# Patient Record
Sex: Female | Born: 1970
Health system: Southern US, Community
[De-identification: ages and names within clinical notes are randomized; demographics above are authoritative.]

## PROBLEM LIST (undated history)

## (undated) DIAGNOSIS — I10 Essential (primary) hypertension: Secondary | ICD-10-CM

## (undated) HISTORY — PX: TUBAL LIGATION: SHX77

## (undated) HISTORY — DX: Essential (primary) hypertension: I10

---

## 2012-08-02 ENCOUNTER — Ambulatory Visit: Payer: Self-pay | Admitting: Family Medicine

## 2012-08-02 VITALS — BP 158/100 | HR 83 | Temp 98.0°F | Resp 20 | Ht 61.05 in | Wt 137.8 lb

## 2012-08-02 DIAGNOSIS — L853 Xerosis cutis: Secondary | ICD-10-CM

## 2012-08-02 DIAGNOSIS — I10 Essential (primary) hypertension: Secondary | ICD-10-CM

## 2012-08-02 MED ORDER — AMLODIPINE BESYLATE 5 MG PO TABS: 5.0000 mg | ORAL_TABLET | Freq: Every day | ORAL | Status: DC

## 2012-08-02 MED ORDER — TRIAMCINOLONE ACETONIDE 0.1 % EX CREA: TOPICAL_CREAM | Freq: Two times a day (BID) | CUTANEOUS | Status: DC

## 2012-08-02 NOTE — Patient Instructions (Signed)

## 2012-08-02 NOTE — Progress Notes (Signed)
@  [email or web address]@  Patient ID: Donna Barrera MRN: [id number], DOB: [birth year], 41 y.o. Date of Encounter: 08/02/2012, 10:49 AM  Primary Physician: No primary provider on file.  Chief Complaint: HTN  HPI: 41 y.o. year old female with history below presents for hypertension follow up.  Diet consists of No CP, HA, visual changes, or focal deficits.   History reviewed. No pertinent past medical history.   Home Meds: Prior to Admission medications   Not on File    Allergies: No Known Allergies  History   Social History  . Marital Status: Married    Spouse Name: N/A    Number of Children: N/A  . Years of Education: N/A   Occupational History  . Not on file.   Social History Main Topics  . Smoking status: Never Smoker   . Smokeless tobacco: Not on file  . Alcohol Use: No  . Drug Use: No  . Sexually Active: Yes    Birth Control/ Protection: None   Other Topics Concern  . Not on file   Social History Narrative  . No narrative on file     Family History  Problem Relation Age of Onset  . Hypertension Maternal Grandmother     Review of Systems: Constitutional: negative for chills, fever, night sweats, weight changes, or fatigue  HEENT: negative for vision changes, hearing loss, congestion, rhinorrhea, ST, epistaxis, or sinus pressure Cardiovascular: negative for chest pain, palpitations, or DOE Respiratory: negative for hemoptysis, wheezing, shortness of breath, or cough Abdominal: negative for abdominal pain, nausea, vomiting, diarrhea, or constipation Dermatological: negative for rash Neurologic: negative for headache, dizziness, or syncope All other systems reviewed and are otherwise negative with the exception to those above and in the HPI.   Physical Exam:   150/108 Blood pressure 158/100, pulse 83, temperature 98 F (36.7 C), temperature source Oral, resp. rate 20, height 5' 1.05" (1.551 m), weight 137 lb 12.8 oz (62.506 kg), last menstrual period  07/12/2012, SpO2 100.00%., Body mass index is 25.99 kg/(m^2). General: Well developed, well nourished, in no acute distress. Head: Normocephalic, atraumatic, eyes without discharge, sclera non-icteric, nares are without discharge. Bilateral auditory canals clear, TM's are without perforation, pearly grey and translucent with reflective cone of light bilaterally. Oral cavity moist, posterior pharynx without exudate, erythema, peritonsillar abscess, or post nasal drip.  Neck: Supple. No thyromegaly. Full ROM. No lymphadenopathy. No carotid bruits. Lungs: Clear bilaterally to auscultation without wheezes, rales, or rhonchi. Breathing is unlabored. Heart: RRR with S1 S2. No murmurs, rubs, or gallops appreciated.  Abdomen: Soft, non-tender, non-distended with normoactive bowel sounds. No hepatosplenomegaly. No rebound/guarding. No obvious abdominal masses. Msk:  Strength and tone normal for age. Extremities/Skin: Warm and dry. No clubbing or cyanosis. No edema. No rashes or suspicious lesions. Distal pulses 2+ and equal bilaterally. Neuro: Alert and oriented X 3. Moves all extremities spontaneously. Gait is normal. CNII-XII grossly in tact. DTR 2+, cerebellar function intact. Rhomberg normal. Psych:  Responds to questions appropriately with a normal affect.    ASSESSMENT AND PLAN:  41 y.o. year old female with hypertension -  Signed, Elvina Sidle, MD 08/02/2012 10:49 AM

## 2016-04-07 ENCOUNTER — Other Ambulatory Visit: Payer: Self-pay | Admitting: Obstetrics and Gynecology

## 2016-04-07 ENCOUNTER — Other Ambulatory Visit (HOSPITAL_COMMUNITY)
Admission: RE | Admit: 2016-04-07 | Discharge: 2016-04-07 | Disposition: A | Discharge status: Home / Self Care | Payer: 59 | Source: Ambulatory Visit | Attending: Obstetrics and Gynecology | Admitting: Obstetrics and Gynecology

## 2016-04-07 DIAGNOSIS — Z01419 Encounter for gynecological examination (general) (routine) without abnormal findings: Secondary | ICD-10-CM | POA: Diagnosis present

## 2016-04-07 DIAGNOSIS — Z1151 Encounter for screening for human papillomavirus (HPV): Secondary | ICD-10-CM | POA: Diagnosis present

## 2016-04-08 ENCOUNTER — Other Ambulatory Visit: Payer: Self-pay | Admitting: Obstetrics and Gynecology

## 2016-04-08 DIAGNOSIS — N6452 Nipple discharge: Secondary | ICD-10-CM

## 2016-04-09 LAB — CYTOLOGY - PAP

## 2016-04-13 ENCOUNTER — Ambulatory Visit
Admission: RE | Admit: 2016-04-13 | Discharge: 2016-04-13 | Disposition: A | Discharge status: Home / Self Care | Payer: 59 | Source: Ambulatory Visit | Attending: Obstetrics and Gynecology | Admitting: Obstetrics and Gynecology

## 2016-04-13 ENCOUNTER — Other Ambulatory Visit: Payer: Self-pay

## 2016-04-13 DIAGNOSIS — N6452 Nipple discharge: Secondary | ICD-10-CM

## 2016-05-07 ENCOUNTER — Telehealth: Payer: Self-pay | Admitting: Emergency Medicine

## 2016-05-07 ENCOUNTER — Ambulatory Visit (INDEPENDENT_AMBULATORY_CARE_PROVIDER_SITE_OTHER): Payer: 59 | Admitting: Family Medicine

## 2016-05-07 VITALS — BP 130/84 | HR 83 | Temp 98.3°F | Resp 16 | Ht 61.5 in | Wt 143.0 lb

## 2016-05-07 DIAGNOSIS — F439 Reaction to severe stress, unspecified: Secondary | ICD-10-CM

## 2016-05-07 DIAGNOSIS — I10 Essential (primary) hypertension: Secondary | ICD-10-CM | POA: Diagnosis not present

## 2016-05-07 DIAGNOSIS — Z658 Other specified problems related to psychosocial circumstances: Secondary | ICD-10-CM | POA: Diagnosis not present

## 2016-05-07 MED ORDER — AMLODIPINE BESYLATE 2.5 MG PO TABS: 2.5000 mg | ORAL_TABLET | Freq: Every day | ORAL | 1 refills | Status: DC

## 2016-05-07 MED ORDER — AMLODIPINE BESYLATE 5 MG PO TABS: 5.0000 mg | ORAL_TABLET | Freq: Every day | ORAL | 2 refills | Status: DC

## 2016-05-07 NOTE — Progress Notes (Signed)
By signing my name below, I, Donna Barrera, attest that this documentation has been prepared under the direction and in the presence of Donna Ray, MD.  Electronically Signed: Verlee Barrera, Medical Scribe. 05/07/16. 5:39 PM.  Subjective:    Patient ID: Donna Barrera, female    DOB: [birth year], 45 y.o.   MRN: [id number]  HPI Chief Complaint  Patient presents with  . Hypertension    HPI Comments: Donna Barrera is a 45 y.o. female who presents to the [facility] complaining of HTN. Pt was last seen by Donna Barrera 08/02/2012 for HTN. Pt stopped taking Norvasc 5 mg QD 2 weeks after her 07/2012 visit. Pt has changed her diet afterwards and her bp has changed back to 120/80's, but she's been eating a lot of "seasoned foods" lately. Pt is not on medication for her high blood pressure, and her bp has been fluctuating for the past 3 weeks. Pt was checking her bp prior to 3 weeks and had nl readings. Pt first noticed her bp when she went to her OB/GYN, Donna Barrera [facility] [address], and her bp was around 146/90. When she got her bp checked at [facility] recently, her readings were 134-150/94, and 174/103. Pt has been stressing about family, school, and work. Pt mentions she's been "making sure everything goes accordingly at home". Pt is a mother of 3 kids at the ages of 51, 76 ,and 31. Pt thinks she's doing a good job at balancing her life, but she gets occasionally overwhelmed. Pt goes to the gym about twice a week. Pt is taking online classes school for [employer]. 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 Pt denies eating any frozen meals. Pt denies experiencing chest pain, light headedness, focal weakness, difficulty with speech, syncope, chest tightness, palpitations, melena, fatigue and any other symptoms.  There are no active problems to display for this patient.  No past medical history on file. Past Surgical History:  Procedure Laterality  Date  . CESAREAN SECTION     No Known Allergies Prior to Admission medications   Medication Sig Start Date End Date Taking? Authorizing Provider  amLODipine (NORVASC) 5 MG tablet Take 1 tablet (5 mg total) by mouth daily. Patient not taking: Reported on 05/07/2016 08/02/12   Donna Haber, MD  triamcinolone cream (KENALOG) 0.1 % Apply topically 2 (two) times daily. Patient not taking: Reported on 05/07/2016 08/02/12   Donna Haber, MD   Social History   Social History  . Marital status: Married    Spouse name: N/A  . Number of children: N/A  . Years of education: N/A   Occupational History  . Not on file.   Social History Main Topics  . Smoking status: Never Smoker  . Smokeless tobacco: Not on file  . Alcohol use No  . Drug use: No  . Sexual activity: Yes    Birth control/ protection: None   Other Topics Concern  . Not on file   Social History Narrative  . No narrative on file   Depression screen [facility] 2/9 05/07/2016  Decreased Interest 0  Down, Depressed, Hopeless 0  PHQ - 2 Score 0   Review of Systems  Constitutional: Negative for fatigue and unexpected weight change.  Respiratory: Negative for chest tightness and shortness of breath.   Cardiovascular: Negative for chest pain, palpitations and leg swelling.  Gastrointestinal: Negative for abdominal pain and blood in stool.  Neurological: Negative for dizziness, syncope, facial asymmetry, speech difficulty,  light-headedness and headaches.   Objective:  Physical Exam  Constitutional: She is oriented to person, place, and time. She appears well-developed and well-nourished.  HENT:  Head: Normocephalic and atraumatic.  Eyes: Conjunctivae and EOM are normal. Pupils are equal, round, and reactive to light. Right eye exhibits no nystagmus. Left eye exhibits no nystagmus.  Neck: Carotid bruit is not present.  Cardiovascular: Normal rate, regular rhythm, normal heart sounds and intact distal pulses.  Exam reveals no  friction rub.   No murmur heard. Pulmonary/Chest: Effort normal and breath sounds normal. No respiratory distress. She has no wheezes. She has no rales.  Abdominal: Soft. She exhibits no pulsatile midline mass. There is no tenderness.  Musculoskeletal: She exhibits no edema (lower extremity).  Neurological: She is alert and oriented to person, place, and time. She displays a negative Romberg sign.  No pronator drift  Skin: Skin is warm and dry.  Psychiatric: She has a normal mood and affect. Her behavior is normal.  Vitals reviewed.  BP 130/84   Pulse 83   Temp 98.3 F (36.8 C)   Resp 16   Ht 5' 1.5" (1.562 m)   Wt 143 lb (64.9 kg)   LMP 04/21/2016 (Approximate)   SpO2 100%   BMI 26.58 kg/m  Assessment & Plan:    Donna Barrera is a 45 y.o. female Essential hypertension - Plan: amLODipine (NORVASC) 2.5 MG tablet, DISCONTINUED: amLODipine (NORVASC) 5 MG tablet  -Multiple elevated readings out of office. Borderline in office. History of hypertension. Will restart Norvasc at low dose, 2.5 mg. Also discussed DASH diet/salt in the diet, exercise/activity, as well as stress management as below. Follow-up in the next 3-4 weeks, sooner if worse.  -no lab work was done today as this was done recently by her other providers, and she plans to bring a copy of this for me to review.   Situational stress  -Stress management techniques discussed. Can return to discuss further if having increased difficulty.  Meds ordered this encounter  Medications  . DISCONTD: amLODipine (NORVASC) 5 MG tablet    Sig: Take 1 tablet (5 mg total) by mouth daily.    Dispense:  30 tablet    Refill:  2  . amLODipine (NORVASC) 2.5 MG tablet    Sig: Take 1 tablet (2.5 mg total) by mouth daily.    Dispense:  30 tablet    Refill:  1   Patient Instructions    Restart amlodipine at low dose (2.59m each day). See information below on salt and other diet changes for blood pressure. Increase walking/low  intensity exercise to most if not all days per week. You can also see other information below on stress and stress management. Follow-up with me in 3-4 weeks to recheck blood pressure, and you can check your blood pressure outside of office and keep a record of those readings for that next visit. Please have your other doctor send a copy of your lab work to me or bring a copy of it for me to review.    Stress and Stress Management Stress is a normal reaction to life events. It is what you feel when life demands more than you are used to or more than you can handle. Some stress can be useful. For example, the stress reaction can help you catch the last bus of the day, study for a test, or meet a deadline at work. But stress that occurs too often or for too long can cause problems. It can  affect your emotional health and interfere with relationships and normal daily activities. Too much stress can weaken your immune system and increase your risk for physical illness. If you already have a medical problem, stress can make it worse. CAUSES  All sorts of life events may cause stress. An event that causes stress for one person may not be stressful for another person. Major life events commonly cause stress. These may be positive or negative. Examples include losing your job, moving into a new home, getting married, having a baby, or losing a loved one. Less obvious life events may also cause stress, especially if they occur day after day or in combination. Examples include working long hours, driving in traffic, caring for children, being in debt, or being in a difficult relationship. SIGNS AND SYMPTOMS Stress may cause emotional symptoms including, the following:  Anxiety. This is feeling worried, afraid, on edge, overwhelmed, or out of control.  Anger. This is feeling irritated or impatient.  Depression. This is feeling sad, down, helpless, or guilty.  Difficulty focusing, remembering, or making  decisions. Stress may cause physical symptoms, including the following:   Aches and pains. These may affect your head, neck, back, stomach, or other areas of your body.  Tight muscles or clenched jaw.  Low energy or trouble sleeping. Stress may cause unhealthy behaviors, including the following:   Eating to feel better (overeating) or skipping meals.  Sleeping too little, too much, or both.  Working too much or putting off tasks (procrastination).  Smoking, drinking alcohol, or using drugs to feel better. DIAGNOSIS  Stress is diagnosed through an assessment by your health care provider. Your health care provider will ask questions about your symptoms and any stressful life events.Your health care provider will also ask about your medical history and may order blood tests or other tests. Certain medical conditions and medicine can cause physical symptoms similar to stress. Mental illness can cause emotional symptoms and unhealthy behaviors similar to stress. Your health care provider may refer you to a mental health professional for further evaluation.  TREATMENT  Stress management is the recommended treatment for stress.The goals of stress management are reducing stressful life events and coping with stress in healthy ways.  Techniques for reducing stressful life events include the following:  Stress identification. Self-monitor for stress and identify what causes stress for you. These skills may help you to avoid some stressful events.  Time management. Set your priorities, keep a calendar of events, and learn to say "no." These tools can help you avoid making too many commitments. Techniques for coping with stress include the following:  Rethinking the problem. Try to think realistically about stressful events rather than ignoring them or overreacting. Try to find the positives in a stressful situation rather than focusing on the negatives.  Exercise. Physical exercise can release  both physical and emotional tension. The key is to find a form of exercise you enjoy and do it regularly.  Relaxation techniques. These relax the body and mind. Examples include yoga, meditation, tai chi, biofeedback, deep breathing, progressive muscle relaxation, listening to music, being out in nature, journaling, and other hobbies. Again, the key is to find one or more that you enjoy and can do regularly.  Healthy lifestyle. Eat a balanced diet, get plenty of sleep, and do not smoke. Avoid using alcohol or drugs to relax.  Strong support network. Spend time with family, friends, or other people you enjoy being around.Express your feelings and talk things over with  someone you trust. Counseling or talktherapy with a mental health professional may be helpful if you are having difficulty managing stress on your own. Medicine is typically not recommended for the treatment of stress.Talk to your health care provider if you think you need medicine for symptoms of stress. HOME CARE INSTRUCTIONS  Keep all follow-up visits as directed by your health care provider.  Take all medicines as directed by your health care provider. SEEK MEDICAL CARE IF:  Your symptoms get worse or you start having new symptoms.  You feel overwhelmed by your problems and can no longer manage them on your own. SEEK IMMEDIATE MEDICAL CARE IF:  You feel like hurting yourself or someone else.   This information is not intended to replace advice given to you by your health care provider. Make sure you discuss any questions you have with your health care provider.   Document Released: 03/10/2001 Document Revised: 10/05/2014 Document Reviewed: 05/09/2013 Elsevier Interactive Patient Education 2016 [place] DASH stands for "Dietary Approaches to Stop Hypertension." The DASH eating plan is a healthy eating plan that has been shown to reduce high blood pressure (hypertension). Additional health  benefits may include reducing the risk of type 2 diabetes mellitus, heart disease, and stroke. The DASH eating plan may also help with weight loss. WHAT DO I NEED TO KNOW ABOUT THE DASH EATING PLAN? For the DASH eating plan, you will follow these general guidelines:  Choose foods with a percent daily value for sodium of less than 5% (as listed on the food label).  Use salt-free seasonings or herbs instead of table salt or sea salt.  Check with your health care provider or pharmacist before using salt substitutes.  Eat lower-sodium products, often labeled as "lower sodium" or "no salt added."  Eat fresh foods.  Eat more vegetables, fruits, and low-fat dairy products.  Choose whole grains. Look for the word "whole" as the first word in the ingredient list.  Choose fish and skinless chicken or [place] more often than red meat. Limit fish, poultry, and meat to 6 oz (170 g) each day.  Limit sweets, desserts, sugars, and sugary drinks.  Choose heart-healthy fats.  Limit cheese to 1 oz (28 g) per day.  Eat more home-cooked food and less restaurant, buffet, and fast food.  Limit fried foods.  Cook foods using methods other than frying.  Limit canned vegetables. If you do use them, rinse them well to decrease the sodium.  When eating at a restaurant, ask that your food be prepared with less salt, or no salt if possible. WHAT FOODS CAN I EAT? Seek help from a dietitian for individual calorie needs. Grains Whole grain or whole wheat bread. Brown rice. Whole grain or whole wheat pasta. Quinoa, bulgur, and whole grain cereals. Low-sodium cereals. Corn or whole wheat flour tortillas. Whole grain cornbread. Whole grain crackers. Low-sodium crackers. Vegetables Fresh or frozen vegetables (raw, steamed, roasted, or grilled). Low-sodium or reduced-sodium tomato and vegetable juices. Low-sodium or reduced-sodium tomato sauce and paste. Low-sodium or reduced-sodium canned vegetables.  Fruits All  fresh, canned (in natural juice), or frozen fruits. Meat and Other Protein Products Ground beef (85% or leaner), grass-fed beef, or beef trimmed of fat. Skinless chicken or [place]. Ground chicken or [place]. Pork trimmed of fat. All fish and seafood. Eggs. Dried beans, peas, or lentils. Unsalted nuts and seeds. Unsalted canned beans. Dairy Low-fat dairy products, such as skim or 1% milk, 2% or reduced-fat cheeses, low-fat  ricotta or cottage cheese, or plain low-fat yogurt. Low-sodium or reduced-sodium cheeses. Fats and Oils Tub margarines without trans fats. Light or reduced-fat mayonnaise and salad dressings (reduced sodium). Avocado. Safflower, olive, or canola oils. Natural peanut or almond butter. Other Unsalted popcorn and pretzels. The items listed above may not be a complete list of recommended foods or beverages. Contact your dietitian for more options. WHAT FOODS ARE NOT RECOMMENDED? Grains White bread. White pasta. White rice. Refined cornbread. Bagels and croissants. Crackers that contain trans fat. Vegetables Creamed or fried vegetables. Vegetables in a cheese sauce. Regular canned vegetables. Regular canned tomato sauce and paste. Regular tomato and vegetable juices. Fruits Dried fruits. Canned fruit in light or heavy syrup. Fruit juice. Meat and Other Protein Products Fatty cuts of meat. Ribs, chicken wings, bacon, sausage, bologna, salami, chitterlings, fatback, hot dogs, bratwurst, and packaged luncheon meats. Salted nuts and seeds. Canned beans with salt. Dairy Whole or 2% milk, cream, half-and-half, and cream cheese. Whole-fat or sweetened yogurt. Full-fat cheeses or blue cheese. Nondairy creamers and whipped toppings. Processed cheese, cheese spreads, or cheese curds. Condiments Onion and garlic salt, seasoned salt, table salt, and sea salt. Canned and packaged gravies. Worcestershire sauce. Tartar sauce. Barbecue sauce. Teriyaki sauce. Soy sauce, including reduced sodium.  Steak sauce. Fish sauce. Oyster sauce. Cocktail sauce. Horseradish. Ketchup and mustard. Meat flavorings and tenderizers. Bouillon cubes. Hot sauce. Tabasco sauce. Marinades. Taco seasonings. Relishes. Fats and Oils Butter, stick margarine, lard, shortening, ghee, and bacon fat. Coconut, palm kernel, or palm oils. Regular salad dressings. Other Pickles and olives. Salted popcorn and pretzels. The items listed above may not be a complete list of foods and beverages to avoid. Contact your dietitian for more information. WHERE CAN I FIND MORE INFORMATION? National Heart, Lung, and Blood Institute: travelstabloid.com   This information is not intended to replace advice given to you by your health care provider. Make sure you discuss any questions you have with your health care provider.   Document Released: 09/03/2011 Document Revised: 10/05/2014 Document Reviewed: 07/19/2013 Elsevier Interactive Patient Education 2016 [employer].   Hypertension Hypertension, commonly called high blood pressure, is when the force of blood pumping through your arteries is too strong. Your arteries are the blood vessels that carry blood from your heart throughout your body. A blood pressure reading consists of a higher number over a lower number, such as 110/72. The higher number (systolic) is the pressure inside your arteries when your heart pumps. The lower number (diastolic) is the pressure inside your arteries when your heart relaxes. Ideally you want your blood pressure below 120/80. Hypertension forces your heart to work harder to pump blood. Your arteries may become narrow or stiff. Having untreated or uncontrolled hypertension can cause heart attack, stroke, kidney disease, and other problems. RISK FACTORS Some risk factors for high blood pressure are controllable. Others are not.  Risk factors you cannot control include:   Race. You may be at higher risk if you are African  American.  Age. Risk increases with age.  Gender. Men are at higher risk than women before age 76 years. After age 75, women are at higher risk than men. Risk factors you can control include:  Not getting enough exercise or physical activity.  Being overweight.  Getting too much fat, sugar, calories, or salt in your diet.  Drinking too much alcohol. SIGNS AND SYMPTOMS Hypertension does not usually cause signs or symptoms. Extremely high blood pressure (hypertensive crisis) may cause headache, anxiety, shortness of  breath, and nosebleed. DIAGNOSIS To check if you have hypertension, your health care provider will measure your blood pressure while you are seated, with your arm held at the level of your heart. It should be measured at least twice using the same arm. Certain conditions can cause a difference in blood pressure between your right and left arms. A blood pressure reading that is higher than normal on one occasion does not mean that you need treatment. If it is not clear whether you have high blood pressure, you may be asked to return on a different day to have your blood pressure checked again. Or, you may be asked to monitor your blood pressure at home for 1 or more weeks. TREATMENT Treating high blood pressure includes making lifestyle changes and possibly taking medicine. Living a healthy lifestyle can help lower high blood pressure. You may need to change some of your habits. Lifestyle changes may include:  Following the DASH diet. This diet is high in fruits, vegetables, and whole grains. It is low in salt, red meat, and added sugars.  Keep your sodium intake below 2,300 mg per day.  Getting at least 30-45 minutes of aerobic exercise at least 4 times per week.  Losing weight if necessary.  Not smoking.  Limiting alcoholic beverages.  Learning ways to reduce stress. Your health care provider may prescribe medicine if lifestyle changes are not enough to get your blood  pressure under control, and if one of the following is true:  You are 31-76 years of age and your systolic blood pressure is above 140.  You are 22 years of age or older, and your systolic blood pressure is above 150.  Your diastolic blood pressure is above 90.  You have diabetes, and your systolic blood pressure is over 322 or your diastolic blood pressure is over 90.  You have kidney disease and your blood pressure is above 140/90.  You have heart disease and your blood pressure is above 140/90. Your personal target blood pressure may vary depending on your medical conditions, your age, and other factors. HOME CARE INSTRUCTIONS  Have your blood pressure rechecked as directed by your health care provider.   Take medicines only as directed by your health care provider. Follow the directions carefully. Blood pressure medicines must be taken as prescribed. The medicine does not work as well when you skip doses. Skipping doses also puts you at risk for problems.  Do not smoke.   Monitor your blood pressure at home as directed by your health care provider. SEEK MEDICAL CARE IF:   You think you are having a reaction to medicines taken.  You have recurrent headaches or feel dizzy.  You have swelling in your ankles.  You have trouble with your vision. SEEK IMMEDIATE MEDICAL CARE IF:  You develop a severe headache or confusion.  You have unusual weakness, numbness, or feel faint.  You have severe chest or abdominal pain.  You vomit repeatedly.  You have trouble breathing. MAKE SURE YOU:   Understand these instructions.  Will watch your condition.  Will get help right away if you are not doing well or get worse.   This information is not intended to replace advice given to you by your health care provider. Make sure you discuss any questions you have with your health care provider.   Document Released: 09/14/2005 Document Revised: 01/29/2015 Document Reviewed:  07/07/2013 Elsevier Interactive Patient Education 2016 [employer].     IF you received an x-Barrera  today, you will receive an invoice from [facility]. Please contact [facility] at [phone] with questions or concerns regarding your invoice.   IF you received labwork today, you will receive an invoice from [employer]. Please contact [facility] at [phone] with questions or concerns regarding your invoice.   Our billing staff will not be able to assist you with questions regarding bills from these companies.  You will be contacted with the lab results as soon as they are available. The fastest way to get your results is to activate your My Chart account. Instructions are located on the last page of this paperwork. If you have not heard from [place] regarding the results in 2 weeks, please contact this office.      I personally performed the services described in this documentation, which was scribed in my presence. The recorded information has been reviewed and considered, and addended by me as needed.   Signed,   Donna Ray, MD [facility].  05/08/16 10:40 PM

## 2016-05-07 NOTE — Telephone Encounter (Signed)
Called pharmacy to change/verify ordered medication Amlodopine- Clarified pt needs to pick up 2.5mg  tab and not the .   tab order cancelled per pharmacist

## 2016-05-07 NOTE — Patient Instructions (Addendum)
Restart amlodipine at low dose (2.75m each day). See information below on salt and other diet changes for blood pressure. Increase walking/low intensity exercise to most if not all days per week. You can also see other information below on stress and stress management. Follow-up with me in 3-4 weeks to recheck blood pressure, and you can check your blood pressure outside of office and keep a record of those readings for that next visit. Please have your other doctor send a copy of your lab work to me or bring a copy of it for me to review.    Stress and Stress Management Stress is a normal reaction to life events. It is what you feel when life demands more than you are used to or more than you can handle. Some stress can be useful. For example, the stress reaction can help you catch the last bus of the day, study for a test, or meet a deadline at work. But stress that occurs too often or for too long can cause problems. It can affect your emotional health and interfere with relationships and normal daily activities. Too much stress can weaken your immune system and increase your risk for physical illness. If you already have a medical problem, stress can make it worse. CAUSES  All sorts of life events may cause stress. An event that causes stress for one person may not be stressful for another person. Major life events commonly cause stress. These may be positive or negative. Examples include losing your job, moving into a new home, getting married, having a baby, or losing a loved one. Less obvious life events may also cause stress, especially if they occur day after day or in combination. Examples include working long hours, driving in traffic, caring for children, being in debt, or being in a difficult relationship. SIGNS AND SYMPTOMS Stress may cause emotional symptoms including, the following:  Anxiety. This is feeling worried, afraid, on edge, overwhelmed, or out of control.  Anger. This is  feeling irritated or impatient.  Depression. This is feeling sad, down, helpless, or guilty.  Difficulty focusing, remembering, or making decisions. Stress may cause physical symptoms, including the following:   Aches and pains. These may affect your head, neck, back, stomach, or other areas of your body.  Tight muscles or clenched jaw.  Low energy or trouble sleeping. Stress may cause unhealthy behaviors, including the following:   Eating to feel better (overeating) or skipping meals.  Sleeping too little, too much, or both.  Working too much or putting off tasks (procrastination).  Smoking, drinking alcohol, or using drugs to feel better. DIAGNOSIS  Stress is diagnosed through an assessment by your health care provider. Your health care provider will ask questions about your symptoms and any stressful life events.Your health care provider will also ask about your medical history and may order blood tests or other tests. Certain medical conditions and medicine can cause physical symptoms similar to stress. Mental illness can cause emotional symptoms and unhealthy behaviors similar to stress. Your health care provider may refer you to a mental health professional for further evaluation.  TREATMENT  Stress management is the recommended treatment for stress.The goals of stress management are reducing stressful life events and coping with stress in healthy ways.  Techniques for reducing stressful life events include the following:  Stress identification. Self-monitor for stress and identify what causes stress for you. These skills may help you to avoid some stressful events.  Time management. Set your priorities, keep  a calendar of events, and learn to say "no." These tools can help you avoid making too many commitments. Techniques for coping with stress include the following:  Rethinking the problem. Try to think realistically about stressful events rather than ignoring them or  overreacting. Try to find the positives in a stressful situation rather than focusing on the negatives.  Exercise. Physical exercise can release both physical and emotional tension. The key is to find a form of exercise you enjoy and do it regularly.  Relaxation techniques. These relax the body and mind. Examples include yoga, meditation, tai chi, biofeedback, deep breathing, progressive muscle relaxation, listening to music, being out in nature, journaling, and other hobbies. Again, the key is to find one or more that you enjoy and can do regularly.  Healthy lifestyle. Eat a balanced diet, get plenty of sleep, and do not smoke. Avoid using alcohol or drugs to relax.  Strong support network. Spend time with family, friends, or other people you enjoy being around.Express your feelings and talk things over with someone you trust. Counseling or talktherapy with a mental health professional may be helpful if you are having difficulty managing stress on your own. Medicine is typically not recommended for the treatment of stress.Talk to your health care provider if you think you need medicine for symptoms of stress. HOME CARE INSTRUCTIONS  Keep all follow-up visits as directed by your health care provider.  Take all medicines as directed by your health care provider. SEEK MEDICAL CARE IF:  Your symptoms get worse or you start having new symptoms.  You feel overwhelmed by your problems and can no longer manage them on your own. SEEK IMMEDIATE MEDICAL CARE IF:  You feel like hurting yourself or someone else.   This information is not intended to replace advice given to you by your health care provider. Make sure you discuss any questions you have with your health care provider.   Document Released: 03/10/2001 Document Revised: 10/05/2014 Document Reviewed: 05/09/2013 Elsevier Interactive Patient Education 2016 [place] DASH stands for "Dietary Approaches to Stop  Hypertension." The DASH eating plan is a healthy eating plan that has been shown to reduce high blood pressure (hypertension). Additional health benefits may include reducing the risk of type 2 diabetes mellitus, heart disease, and stroke. The DASH eating plan may also help with weight loss. WHAT DO I NEED TO KNOW ABOUT THE DASH EATING PLAN? For the DASH eating plan, you will follow these general guidelines:  Choose foods with a percent daily value for sodium of less than 5% (as listed on the food label).  Use salt-free seasonings or herbs instead of table salt or sea salt.  Check with your health care provider or pharmacist before using salt substitutes.  Eat lower-sodium products, often labeled as "lower sodium" or "no salt added."  Eat fresh foods.  Eat more vegetables, fruits, and low-fat dairy products.  Choose whole grains. Look for the word "whole" as the first word in the ingredient list.  Choose fish and skinless chicken or [place] more often than red meat. Limit fish, poultry, and meat to 6 oz (170 g) each day.  Limit sweets, desserts, sugars, and sugary drinks.  Choose heart-healthy fats.  Limit cheese to 1 oz (28 g) per day.  Eat more home-cooked food and less restaurant, buffet, and fast food.  Limit fried foods.  Cook foods using methods other than frying.  Limit canned vegetables. If you do use them, rinse  them well to decrease the sodium.  When eating at a restaurant, ask that your food be prepared with less salt, or no salt if possible. WHAT FOODS CAN I EAT? Seek help from a dietitian for individual calorie needs. Grains Whole grain or whole wheat bread. Brown rice. Whole grain or whole wheat pasta. Quinoa, bulgur, and whole grain cereals. Low-sodium cereals. Corn or whole wheat flour tortillas. Whole grain cornbread. Whole grain crackers. Low-sodium crackers. Vegetables Fresh or frozen vegetables (raw, steamed, roasted, or grilled). Low-sodium or  reduced-sodium tomato and vegetable juices. Low-sodium or reduced-sodium tomato sauce and paste. Low-sodium or reduced-sodium canned vegetables.  Fruits All fresh, canned (in natural juice), or frozen fruits. Meat and Other Protein Products Ground beef (85% or leaner), grass-fed beef, or beef trimmed of fat. Skinless chicken or [place]. Ground chicken or [place]. Pork trimmed of fat. All fish and seafood. Eggs. Dried beans, peas, or lentils. Unsalted nuts and seeds. Unsalted canned beans. Dairy Low-fat dairy products, such as skim or 1% milk, 2% or reduced-fat cheeses, low-fat ricotta or cottage cheese, or plain low-fat yogurt. Low-sodium or reduced-sodium cheeses. Fats and Oils Tub margarines without trans fats. Light or reduced-fat mayonnaise and salad dressings (reduced sodium). Avocado. Safflower, olive, or canola oils. Natural peanut or almond butter. Other Unsalted popcorn and pretzels. The items listed above may not be a complete list of recommended foods or beverages. Contact your dietitian for more options. WHAT FOODS ARE NOT RECOMMENDED? Grains White bread. White pasta. White rice. Refined cornbread. Bagels and croissants. Crackers that contain trans fat. Vegetables Creamed or fried vegetables. Vegetables in a cheese sauce. Regular canned vegetables. Regular canned tomato sauce and paste. Regular tomato and vegetable juices. Fruits Dried fruits. Canned fruit in light or heavy syrup. Fruit juice. Meat and Other Protein Products Fatty cuts of meat. Ribs, chicken wings, bacon, sausage, bologna, salami, chitterlings, fatback, hot dogs, bratwurst, and packaged luncheon meats. Salted nuts and seeds. Canned beans with salt. Dairy Whole or 2% milk, cream, half-and-half, and cream cheese. Whole-fat or sweetened yogurt. Full-fat cheeses or blue cheese. Nondairy creamers and whipped toppings. Processed cheese, cheese spreads, or cheese curds. Condiments Onion and garlic salt, seasoned salt,  table salt, and sea salt. Canned and packaged gravies. Worcestershire sauce. Tartar sauce. Barbecue sauce. Teriyaki sauce. Soy sauce, including reduced sodium. Steak sauce. Fish sauce. Oyster sauce. Cocktail sauce. Horseradish. Ketchup and mustard. Meat flavorings and tenderizers. Bouillon cubes. Hot sauce. Tabasco sauce. Marinades. Taco seasonings. Relishes. Fats and Oils Butter, stick margarine, lard, shortening, ghee, and bacon fat. Coconut, palm kernel, or palm oils. Regular salad dressings. Other Pickles and olives. Salted popcorn and pretzels. The items listed above may not be a complete list of foods and beverages to avoid. Contact your dietitian for more information. WHERE CAN I FIND MORE INFORMATION? National Heart, Lung, and Blood Institute: travelstabloid.com   This information is not intended to replace advice given to you by your health care provider. Make sure you discuss any questions you have with your health care provider.   Document Released: 09/03/2011 Document Revised: 10/05/2014 Document Reviewed: 07/19/2013 Elsevier Interactive Patient Education 2016 [employer].   Hypertension Hypertension, commonly called high blood pressure, is when the force of blood pumping through your arteries is too strong. Your arteries are the blood vessels that carry blood from your heart throughout your body. A blood pressure reading consists of a higher number over a lower number, such as 110/72. The higher number (systolic) is the pressure inside your arteries when  your heart pumps. The lower number (diastolic) is the pressure inside your arteries when your heart relaxes. Ideally you want your blood pressure below 120/80. Hypertension forces your heart to work harder to pump blood. Your arteries may become narrow or stiff. Having untreated or uncontrolled hypertension can cause heart attack, stroke, kidney disease, and other problems. RISK FACTORS Some risk  factors for high blood pressure are controllable. Others are not.  Risk factors you cannot control include:   Race. You may be at higher risk if you are African American.  Age. Risk increases with age.  Gender. Men are at higher risk than women before age 69 years. After age 35, women are at higher risk than men. Risk factors you can control include:  Not getting enough exercise or physical activity.  Being overweight.  Getting too much fat, sugar, calories, or salt in your diet.  Drinking too much alcohol. SIGNS AND SYMPTOMS Hypertension does not usually cause signs or symptoms. Extremely high blood pressure (hypertensive crisis) may cause headache, anxiety, shortness of breath, and nosebleed. DIAGNOSIS To check if you have hypertension, your health care provider will measure your blood pressure while you are seated, with your arm held at the level of your heart. It should be measured at least twice using the same arm. Certain conditions can cause a difference in blood pressure between your right and left arms. A blood pressure reading that is higher than normal on one occasion does not mean that you need treatment. If it is not clear whether you have high blood pressure, you may be asked to return on a different day to have your blood pressure checked again. Or, you may be asked to monitor your blood pressure at home for 1 or more weeks. TREATMENT Treating high blood pressure includes making lifestyle changes and possibly taking medicine. Living a healthy lifestyle can help lower high blood pressure. You may need to change some of your habits. Lifestyle changes may include:  Following the DASH diet. This diet is high in fruits, vegetables, and whole grains. It is low in salt, red meat, and added sugars.  Keep your sodium intake below 2,300 mg per day.  Getting at least 30-45 minutes of aerobic exercise at least 4 times per week.  Losing weight if necessary.  Not  smoking.  Limiting alcoholic beverages.  Learning ways to reduce stress. Your health care provider may prescribe medicine if lifestyle changes are not enough to get your blood pressure under control, and if one of the following is true:  You are 44-32 years of age and your systolic blood pressure is above 140.  You are 56 years of age or older, and your systolic blood pressure is above 150.  Your diastolic blood pressure is above 90.  You have diabetes, and your systolic blood pressure is over 563 or your diastolic blood pressure is over 90.  You have kidney disease and your blood pressure is above 140/90.  You have heart disease and your blood pressure is above 140/90. Your personal target blood pressure may vary depending on your medical conditions, your age, and other factors. HOME CARE INSTRUCTIONS  Have your blood pressure rechecked as directed by your health care provider.   Take medicines only as directed by your health care provider. Follow the directions carefully. Blood pressure medicines must be taken as prescribed. The medicine does not work as well when you skip doses. Skipping doses also puts you at risk for problems.  Do not smoke.  Monitor your blood pressure at home as directed by your health care provider. SEEK MEDICAL CARE IF:   You think you are having a reaction to medicines taken.  You have recurrent headaches or feel dizzy.  You have swelling in your ankles.  You have trouble with your vision. SEEK IMMEDIATE MEDICAL CARE IF:  You develop a severe headache or confusion.  You have unusual weakness, numbness, or feel faint.  You have severe chest or abdominal pain.  You vomit repeatedly.  You have trouble breathing. MAKE SURE YOU:   Understand these instructions.  Will watch your condition.  Will get help right away if you are not doing well or get worse.   This information is not intended to replace advice given to you by your health  care provider. Make sure you discuss any questions you have with your health care provider.   Document Released: 09/14/2005 Document Revised: 01/29/2015 Document Reviewed: 07/07/2013 Elsevier Interactive Patient Education 2016 [employer].     IF you received an x-ray today, you will receive an invoice from [facility]. Please contact [facility] at [phone] with questions or concerns regarding your invoice.   IF you received labwork today, you will receive an invoice from [employer]. Please contact [facility] at [phone] with questions or concerns regarding your invoice.   Our billing staff will not be able to assist you with questions regarding bills from these companies.  You will be contacted with the lab results as soon as they are available. The fastest way to get your results is to activate your My Chart account. Instructions are located on the last page of this paperwork. If you have not heard from [place] regarding the results in 2 weeks, please contact this office.

## 2016-05-08 ENCOUNTER — Telehealth: Payer: Self-pay

## 2016-05-08 NOTE — Telephone Encounter (Signed)
Patient dropped off lab results for Dr. Neva SeatGreene to review. I will place the results inside of the nurse's box at 102. Thanks

## 2016-05-29 ENCOUNTER — Ambulatory Visit (INDEPENDENT_AMBULATORY_CARE_PROVIDER_SITE_OTHER): Payer: 59 | Admitting: Family Medicine

## 2016-05-29 VITALS — BP 140/70 | HR 88 | Temp 98.2°F | Resp 16 | Ht 62.6 in | Wt 150.2 lb

## 2016-05-29 DIAGNOSIS — I10 Essential (primary) hypertension: Secondary | ICD-10-CM | POA: Diagnosis not present

## 2016-05-29 DIAGNOSIS — E162 Hypoglycemia, unspecified: Secondary | ICD-10-CM

## 2016-05-29 LAB — BASIC METABOLIC PANEL
BUN: 14 mg/dL (ref 7–25)
CO2: 28 mmol/L (ref 20–31)
Calcium: 9.6 mg/dL (ref 8.6–10.2)
Chloride: 104 mmol/L (ref 98–110)
Creat: 0.94 mg/dL (ref 0.50–1.10)
Glucose, Bld: 77 mg/dL (ref 65–99)
Potassium: 4.3 mmol/L (ref 3.5–5.3)
Sodium: 139 mmol/L (ref 135–146)

## 2016-05-29 MED ORDER — AMLODIPINE BESYLATE 2.5 MG PO TABS: 2.5000 mg | ORAL_TABLET | Freq: Every day | ORAL | 1 refills | Status: DC

## 2016-05-29 NOTE — Progress Notes (Signed)
Subjective:  By signing my name below, I, Donna Barrera, attest that this documentation has been prepared under the direction and in the presence of Donna StaggersJeffrey Evon Dejarnett, MD. Electronically Signed: Stann Oresung-Kai Barrera, Scribe. 05/29/2016 , 12:12 PM .  Patient was seen in Room 7 .   Patient ID: Donna Barrera, female    DOB: 1971/03/01, 45 y.o.   MRN: [id number] Chief Complaint  Patient presents with  . Follow-up    HTN   HPI Donna Barrera is a 45 y.o. female  Patient was last seen on Aug 10th for HTN. She was restarted on medications, amlodipine 2.5mg  qd, and given dash diet handout, as well as instructions on stress and stress management.   Patient states her BP readings have been running normal when her daughter is at home, about 124/80. Today, her BP was 140/70 during triage. She started making diet changes at home; for example, switching to [place] bacon instead of regular bacon. Her stress at work has been about the same, as well as stress at home. Her son (currently Printmakerfreshman) plays junior varsity football at [employer]. She denies chest pain, shortness of breath, fever, chills, or sweats.   Patient had [facility]work done at [facility] on Aug 10th, which showed:  Total Cholesterol 179, HDL 63, LDL 103, TRIG 66, Glucose 45, BP 154/94.  She was fasting that morning and wasn't feeling bad. She does have lightheadedness every few months. She also has headaches and lightheadedness associated with her menses.   She plans to follow up with her PCP Gwynneth Aliment(SANDERS,ROBYN N, MD) on Oct 11.   There are no active problems to display for this patient.  No past medical history on file. Past Surgical History:  Procedure Laterality Date  . CESAREAN SECTION     No Known Allergies Prior to Admission medications   Medication Sig Start Date End Date Taking? Authorizing Provider  amLODipine (NORVASC) 2.5 MG tablet Take 1 tablet (2.5 mg total) by mouth daily. 05/07/16  Yes Shade FloodJeffrey R Ed Mandich, MD    Social History   Social History  . Marital status: Married    Spouse name: N/A  . Number of children: N/A  . Years of education: N/A   Occupational History  . Not on file.   Social History Main Topics  . Smoking status: Never Smoker  . Smokeless tobacco: Not on file  . Alcohol use No  . Drug use: No  . Sexual activity: Yes    Birth control/ protection: None   Other Topics Concern  . Not on file   Social History Narrative  . No narrative on file   Review of Systems  Constitutional: Negative for fatigue and unexpected weight change.  Respiratory: Negative for chest tightness and shortness of breath.   Cardiovascular: Negative for chest pain, palpitations and leg swelling.  Gastrointestinal: Negative for abdominal pain and blood in stool.  Neurological: Negative for dizziness, syncope, light-headedness and headaches.       Objective:   Physical Exam  Constitutional: She is oriented to person, place, and time. She appears well-developed and well-nourished.  HENT:  Head: Normocephalic and atraumatic.  Eyes: Conjunctivae and EOM are normal. Pupils are equal, round, and reactive to light.  Neck: Carotid bruit is not present.  Cardiovascular: Normal rate, regular rhythm, normal heart sounds and intact distal pulses.   Pulmonary/Chest: Effort normal and breath sounds normal.  Abdominal: Soft. She exhibits no pulsatile midline mass. There is no tenderness.  Musculoskeletal: She exhibits no edema.  Neurological:  She is alert and oriented to person, place, and time.  Skin: Skin is warm and dry.  Psychiatric: She has a normal mood and affect. Her behavior is normal.  Vitals reviewed.   Vitals:   05/29/16 1112  BP: 140/70  Pulse: 88  Resp: 16  Temp: 98.2 F (36.8 C)  SpO2: 99%  Weight: 150 lb 3.2 oz (68.1 kg)  Height: 5' 2.6" (1.59 m)      Assessment & Plan:   Finesse Fielder is a 45 y.o. female Hypoglycemia - Plan: Basic metabolic panel  - noted on  outside labs. Asymptomatic. Repeat BMP, and advised if any hypoglycemic symptoms, to be seen right away for further evaluation.  Essential hypertension - Plan: amLODipine (NORVASC) 2.5 MG tablet, Basic metabolic panel  - Overall stable. No change in medications for now. If remaining over 140/90, can increase to 5 mg QD. RTC precautions.  Meds ordered this encounter  Medications  . amLODipine (NORVASC) 2.5 MG tablet    Sig: Take 1 tablet (2.5 mg total) by mouth daily.    Dispense:  90 tablet    Refill:  1   Patient Instructions   I will check your electrolytes, including blood sugar. If you have any lightheadedness, dizziness, fatigue, recommend checking your blood sugar at home, and you can also check your blood sugar intermittently to see if your have any low readings. If you do have more readings in the 40s or 50s, recommend follow-up with me or other medical provider to determine reason.Return to the clinic or go to the nearest emergency room if any of your symptoms worsen or new symptoms occur.  No change in blood pressure medications for now. Continue Norvasc 2.5 mg each day, but if readings remain over 140/90, you can increase to 5 mg until you're seen by your primary provider.    IF you received an x-ray today, you will receive an invoice from [facility]. Please contact [facility] at [phone] with questions or concerns regarding your invoice.   IF you received labwork today, you will receive an invoice from [employer]. Please contact [facility] at [phone] with questions or concerns regarding your invoice.   Our billing staff will not be able to assist you with questions regarding bills from these companies.  You will be contacted with the lab results as soon as they are available. The fastest way to get your results is to activate your My Chart account. Instructions are located on the last page of this paperwork. If you have not  heard from [place] regarding the results in 2 weeks, please contact this office.         I personally performed the services described in this documentation, which was scribed in my presence. The recorded information has been reviewed and considered, and addended by me as needed.   Signed,   Donna Staggers, MD [facility].  06/02/16 8:58 AM

## 2016-05-29 NOTE — Patient Instructions (Addendum)
I will check your electrolytes, including blood sugar. If you have any lightheadedness, dizziness, fatigue, recommend checking your blood sugar at home, and you can also check your blood sugar intermittently to see if your have any low readings. If you do have more readings in the 40s or 50s, recommend follow-up with me or other medical provider to determine reason.Return to the clinic or go to the nearest emergency room if any of your symptoms worsen or new symptoms occur.  No change in blood pressure medications for now. Continue Norvasc 2.5 mg each day, but if readings remain over 140/90, you can increase to 5 mg until you're seen by your primary provider.    IF you received an x-ray today, you will receive an [facility]. Please contact [facility] at [phone] with questions or concerns regarding your invoice.   IF you received labwork today, you will receive an invoice from [facility]. Please contact [facility] at [phone] with questions or concerns regarding your invoice.   Our billing staff will not be able to assist you with questions regarding bills from these companies.  You will be contacted with the lab results as soon as they are available. The fastest way to get your results is to activate your My Chart account. Instructions are located on the last page of this paperwork. If you have not heard from us regarding the results in 2 weeks, please contact this office.

## 2017-01-19 LAB — HM COLONOSCOPY

## 2017-07-14 DIAGNOSIS — Z803 Family history of malignant neoplasm of breast: Secondary | ICD-10-CM | POA: Insufficient documentation

## 2017-07-23 ENCOUNTER — Other Ambulatory Visit: Payer: Self-pay | Admitting: Surgery

## 2017-07-23 DIAGNOSIS — N6002 Solitary cyst of left breast: Secondary | ICD-10-CM | POA: Insufficient documentation

## 2017-07-29 ENCOUNTER — Other Ambulatory Visit: Payer: Self-pay | Admitting: Surgery

## 2017-07-29 DIAGNOSIS — N6002 Solitary cyst of left breast: Secondary | ICD-10-CM

## 2017-08-03 ENCOUNTER — Ambulatory Visit
Admission: RE | Admit: 2017-08-03 | Discharge: 2017-08-03 | Disposition: A | Discharge status: Home / Self Care | Payer: 59 | Source: Ambulatory Visit | Attending: Surgery | Admitting: Surgery

## 2017-08-03 ENCOUNTER — Ambulatory Visit
Admission: RE | Admit: 2017-08-03 | Discharge: 2017-08-03 | Disposition: A | Discharge status: Home / Self Care | Payer: BLUE CROSS/BLUE SHIELD | Source: Ambulatory Visit | Attending: Surgery | Admitting: Surgery

## 2017-08-03 DIAGNOSIS — N6002 Solitary cyst of left breast: Secondary | ICD-10-CM

## 2017-12-31 IMAGING — MG 2D DIGITAL DIAGNOSTIC BILATERAL MAMMOGRAM WITH CAD AND ADJUNCT T
8 of 17 series · 8 of 37 positions shown · non-contrast
Comparison: Previous exam(s).

CLINICAL DATA: 45-year-old female presenting for 6 months of
bilateral nipple discharge. There was 1 instance of bloody left
nipple discharge, however it is now clear. Discharge is spontaneous.

EXAM:
2D DIGITAL DIAGNOSTIC BILATERAL MAMMOGRAM WITH CAD AND ADJUNCT TOMO

[R ML]
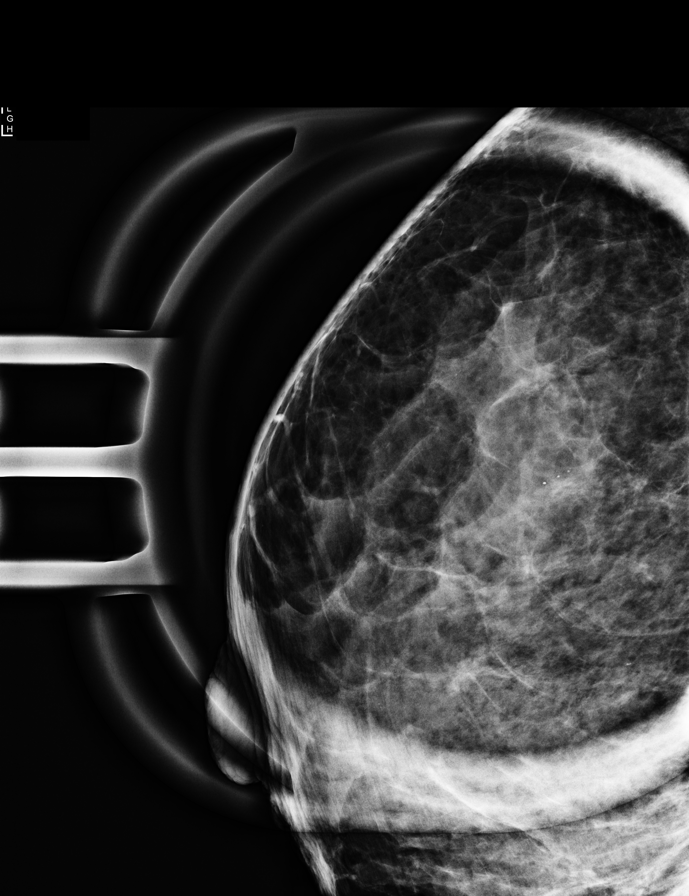

[L ML]
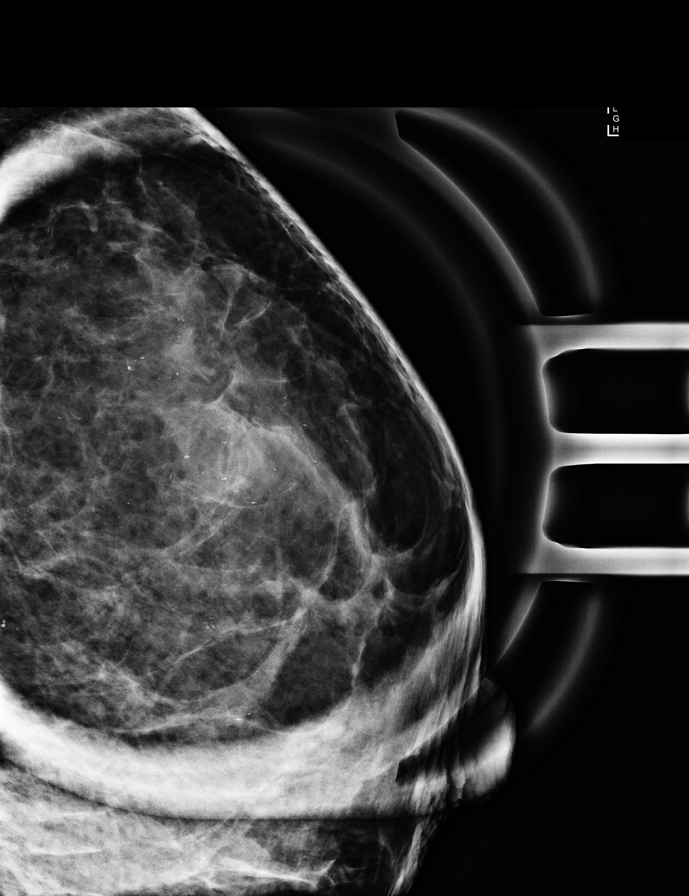

[R MLO]
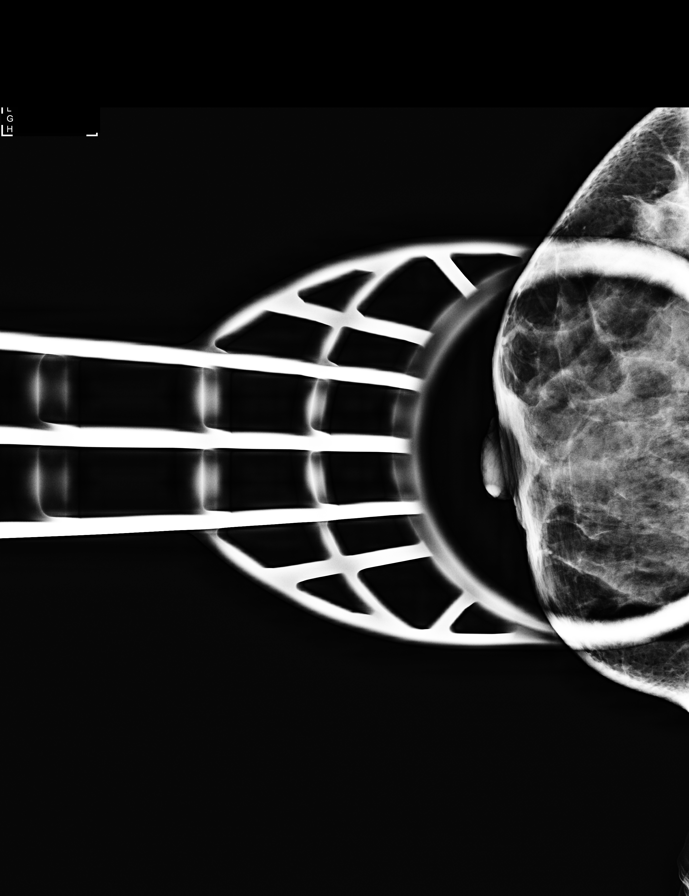

[L MLO (1 of 2)]
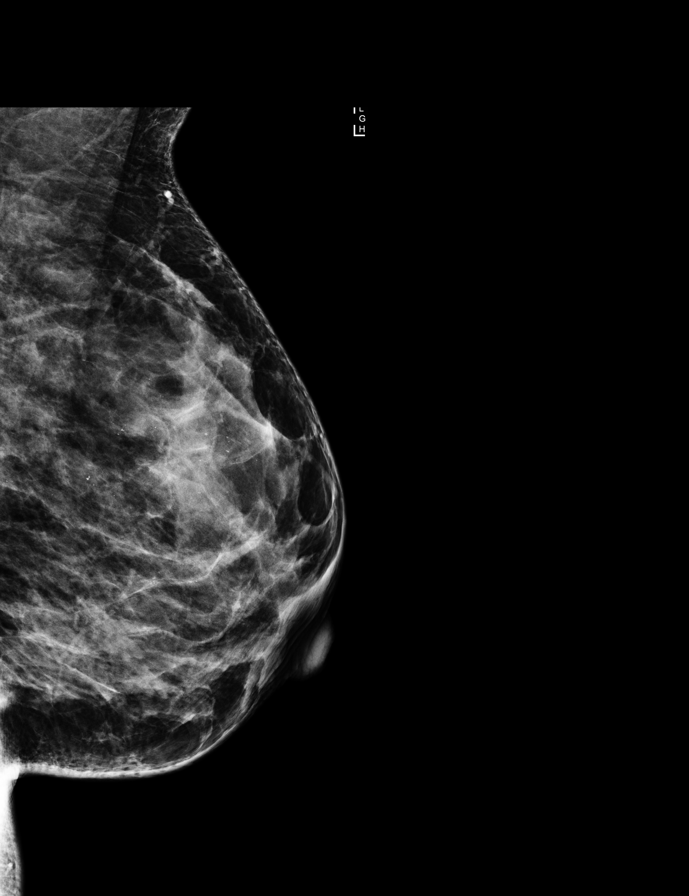

[L CC synth-2D]
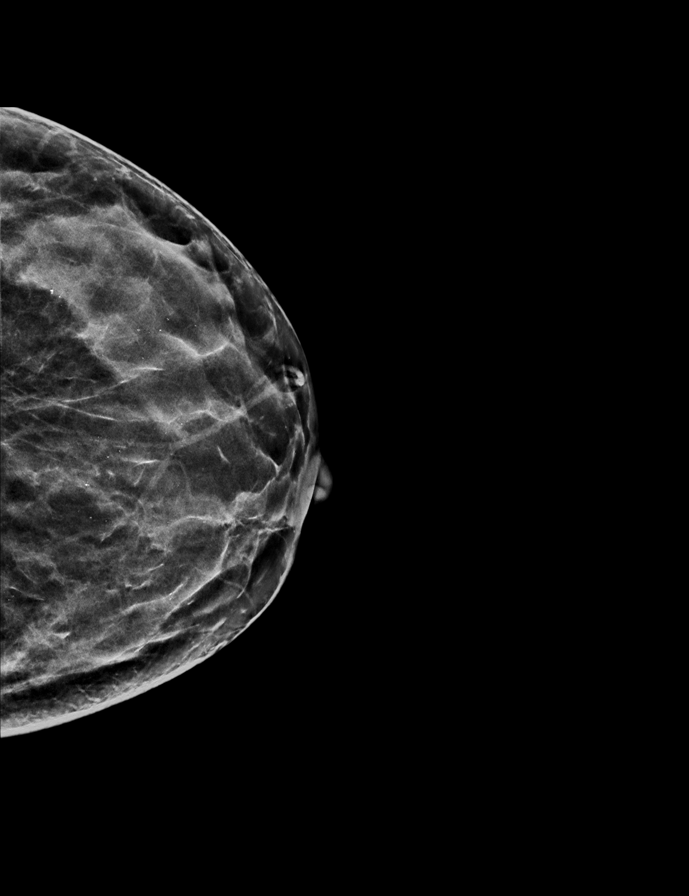

[R MLO synth-2D]
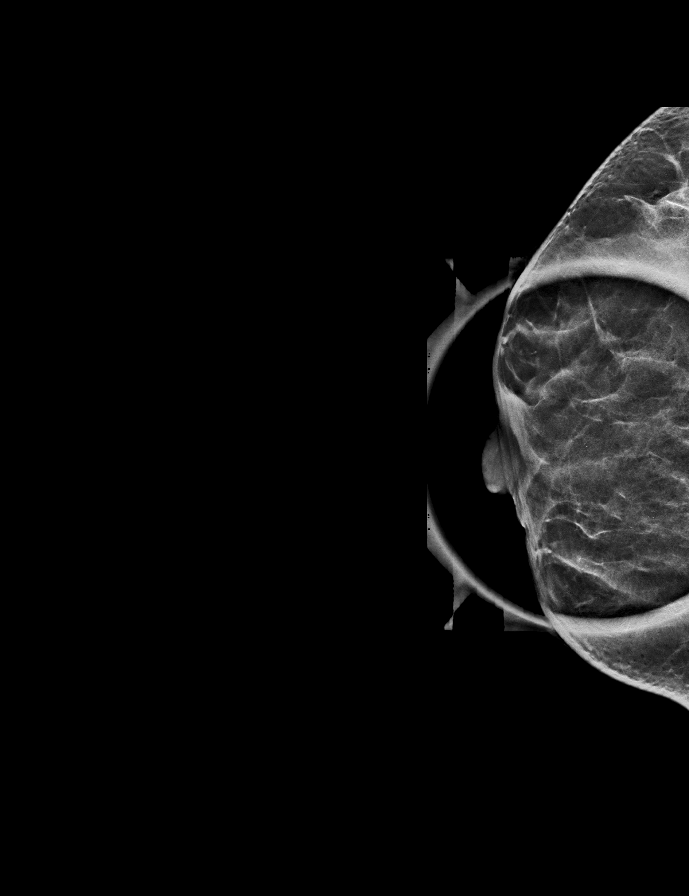

[L MLO (2 of 2)]
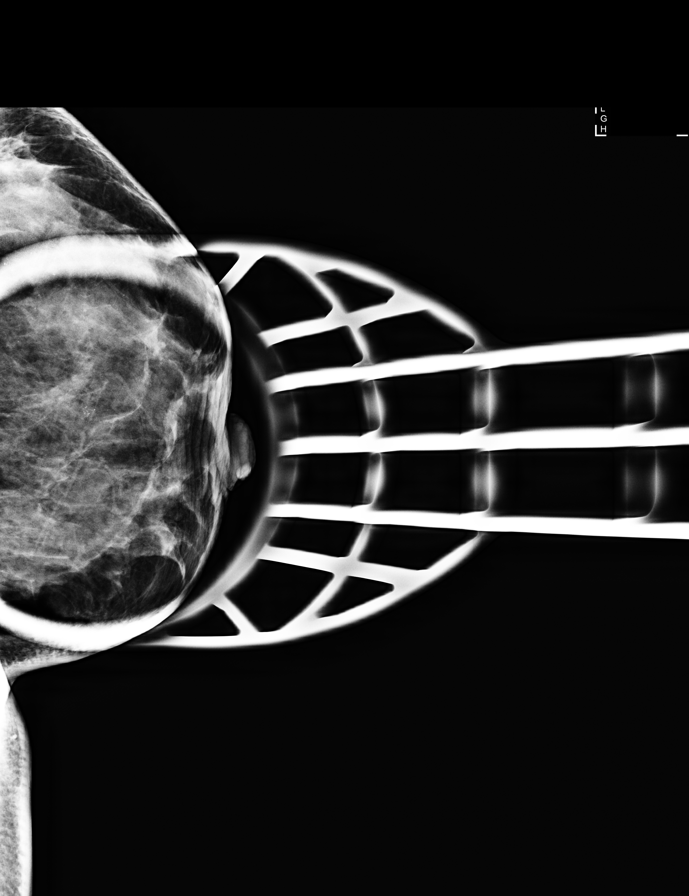

[L MLO synth-2D]
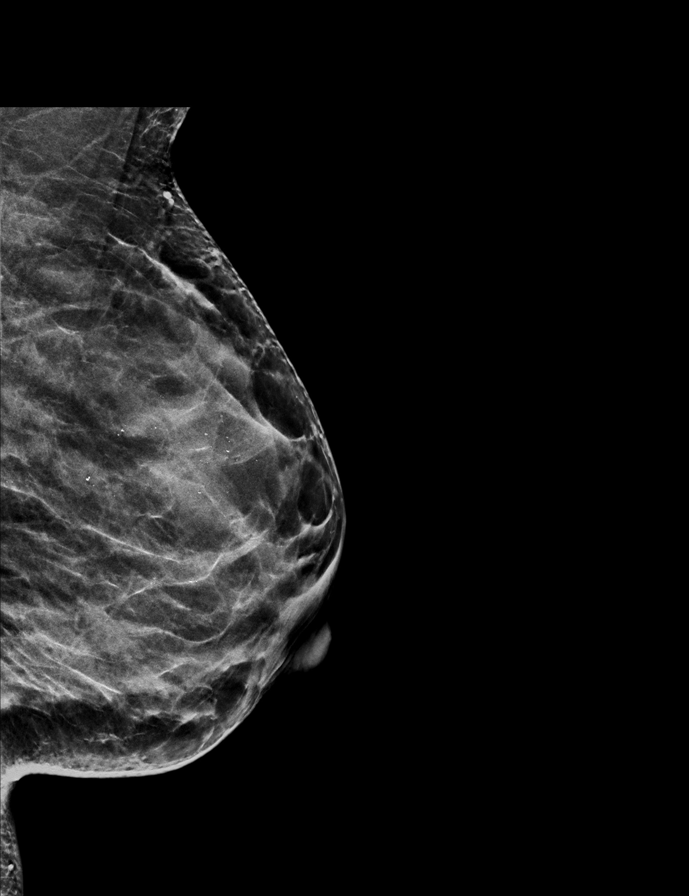

[8 of 37 positions shown; findings below may reference images not displayed]

ACR Breast Density Category d: The breast tissue is extremely dense,
which lowers the sensitivity of mammography.
FINDINGS: In the upper-outer quadrant of the bilateral breasts, left greater
than right, there are calcifications with each demonstrate layering
on the true lateral view, consistent with benign milk of calcium. No
other suspicious calcifications, masses or areas of distortion are
seen in the bilateral breasts.

Mammographic images were processed with CAD.

On my exam of the bilateral breasts, multi-duct milky and yellow
nipple discharge was elicited. No evidence of bloody nipple
discharge. No palpable masses are identified in the subareolar
breasts bilaterally.
IMPRESSION: 1. Given physical exam findings of bilateral nipple discharge from
multiple ducts which is yellow and milky in character, this is most
likely hormonal/physiologic.

2. Benign milk of calcium noted in the bilateral breasts on
mammography.

3.  No mammographic evidence of malignancy in the bilateral breasts.

RECOMMENDATION:
1. Clinical follow-up recommended for the bilateral nipple
discharge. The patient was made aware that if the bloody nipple
discharge recurs, she should notify her physician.

2.  Screening mammogram in one year.(Code:OC-A-IR3)

I have discussed the findings and recommendations with the patient.
Results were also provided in writing at the conclusion of the
visit. If applicable, a reminder letter will be sent to the patient
regarding the next appointment.

BI-RADS CATEGORY  2: Benign.

## 2018-04-06 DIAGNOSIS — N6452 Nipple discharge: Secondary | ICD-10-CM | POA: Insufficient documentation

## 2018-04-06 DIAGNOSIS — D259 Leiomyoma of uterus, unspecified: Secondary | ICD-10-CM | POA: Insufficient documentation

## 2018-06-08 DIAGNOSIS — Z0000 Encounter for general adult medical examination without abnormal findings: Secondary | ICD-10-CM

## 2018-06-08 DIAGNOSIS — I10 Essential (primary) hypertension: Secondary | ICD-10-CM

## 2018-10-10 ENCOUNTER — Ambulatory Visit: Payer: 59 | Admitting: Internal Medicine

## 2018-12-02 DIAGNOSIS — I10 Essential (primary) hypertension: Secondary | ICD-10-CM | POA: Insufficient documentation

## 2019-06-09 DIAGNOSIS — Z800 Family history of malignant neoplasm of digestive organs: Secondary | ICD-10-CM | POA: Insufficient documentation

## 2019-06-13 ENCOUNTER — Encounter: Payer: BLUE CROSS/BLUE SHIELD | Admitting: Internal Medicine

## 2020-02-24 ENCOUNTER — Ambulatory Visit: Payer: Self-pay | Attending: Internal Medicine

## 2020-02-24 DIAGNOSIS — Z23 Encounter for immunization: Secondary | ICD-10-CM

## 2020-02-24 NOTE — Progress Notes (Signed)
   Covid-19 Vaccination Clinic  Name:  Tanicia Wolaver    MRN: [id number] DOB: 02/21/1971  02/24/2020  [name] was observed post Covid-19 immunization for 15 minutes without incident. She was provided with Vaccine Information Sheet and instruction to access the V-Safe system.   Ms. Dicke was instructed to call 911 with any severe reactions post vaccine: Marland Kitchen Difficulty breathing  . Swelling of face and throat  . A fast heartbeat  . A bad rash all over body  . Dizziness and weakness   Immunizations Administered    Name Date Dose VIS Date Route   Pfizer COVID-19 Vaccine 02/24/2020 10:33 AM 0.3 mL 11/22/2018 Intramuscular   Manufacturer: [employer], [employer]   Lot: [id number]   NDC: [address]-4

## 2020-03-18 ENCOUNTER — Ambulatory Visit: Payer: Self-pay | Attending: Internal Medicine

## 2020-03-18 DIAGNOSIS — Z23 Encounter for immunization: Secondary | ICD-10-CM

## 2020-03-18 NOTE — Progress Notes (Signed)
   Covid-19 Vaccination Clinic  Name:  Donna Barrera    MRN: [id number] DOB: 12-19-1970  03/18/2020  [name] was observed post Covid-19 immunization for 15 minutes without incident. She was provided with Vaccine Information Sheet and instruction to access the V-Safe system.   [name] was instructed to call 911 with any severe reactions post vaccine: Marland Kitchen Difficulty breathing  . Swelling of face and throat  . A fast heartbeat  . A bad rash all over body  . Dizziness and weakness   Immunizations Administered    Name Date Dose VIS Date Route   Pfizer COVID-19 Vaccine 03/18/2020  8:24 AM 0.3 mL 11/22/2018 Intramuscular   Manufacturer: [employer], [employer]   Lot: [id number]   NDC: [address]-5

## 2020-10-14 ENCOUNTER — Encounter: Payer: Self-pay | Admitting: Internal Medicine

## 2020-12-31 ENCOUNTER — Encounter: Payer: Self-pay | Admitting: Internal Medicine

## 2021-01-22 ENCOUNTER — Other Ambulatory Visit: Payer: Self-pay

## 2021-01-22 ENCOUNTER — Ambulatory Visit (INDEPENDENT_AMBULATORY_CARE_PROVIDER_SITE_OTHER): Payer: 59 | Admitting: Nurse Practitioner

## 2021-01-22 ENCOUNTER — Encounter: Payer: Self-pay | Admitting: Nurse Practitioner

## 2021-01-22 VITALS — BP 132/84 | HR 74 | Temp 98.8°F | Ht 60.8 in | Wt 146.6 lb

## 2021-01-22 DIAGNOSIS — Z7689 Persons encountering health services in other specified circumstances: Secondary | ICD-10-CM | POA: Diagnosis not present

## 2021-01-22 DIAGNOSIS — I10 Essential (primary) hypertension: Secondary | ICD-10-CM

## 2021-01-22 NOTE — Progress Notes (Signed)
I,Tianna Badgett,acting as a Neurosurgeon for [employer], NP.,have documented all relevant documentation on the behalf of [employer], NP,as directed by  Charlesetta Ivory, NP while in the presence of Charlesetta Ivory, NP.  This visit occurred during the SARS-CoV-2 public health emergency.  Safety protocols were in place, including screening questions prior to the visit, additional usage of staff PPE, and extensive cleaning of exam room while observing appropriate contact time as indicated for disinfecting solutions.  Subjective:     Patient ID: Donna Barrera , female    DOB: 1970-10-15 , 50 y.o.   MRN: [id number]   Chief Complaint  Patient presents with  . Establish Care    HPI  Patient is here to establish care. She was seen by Dr Allyne Gee in the past but her insurance changed and was unable to be seen for a while.  She last a PCP in September 2021. Went over her medication and medical and family history. She is only taking telmisartan 40 mg. Amlodipine 2.5 mg. She only has hypertension. She is established with Dr. Cliffton Asters but she wants to switch back to [facility]. She has no other concerns and will make an appointment to come in to get her complete physical exam.       Past Medical History:  Diagnosis Date  . Hypertension      Family History  Problem Relation Age of Onset  . Breast cancer Mother   . Cancer Mother   . Hypertension Maternal Grandmother   . Cancer Maternal Grandmother      Current Outpatient Medications:  .  amLODipine (NORVASC) 2.5 MG tablet, Take 1 tablet (2.5 mg total) by mouth daily., Disp: 90 tablet, Rfl: 1 .  telmisartan (MICARDIS) 40 MG tablet, Take 1 tablet by mouth daily., Disp: , Rfl:    No Known Allergies   Review of Systems  Constitutional: Negative.  Negative for chills, fatigue and fever.  HENT: Negative for sinus pain.   Respiratory: Negative.  Negative for cough, shortness of breath and wheezing.   Cardiovascular:  Negative.  Negative for chest pain and palpitations.  Gastrointestinal: Negative.  Negative for constipation, diarrhea, nausea and vomiting.  Musculoskeletal: Negative for arthralgias and joint swelling.  Neurological: Negative.      Today's Vitals   01/22/21 1436  BP: 132/84  Pulse: 74  Temp: 98.8 F (37.1 C)  TempSrc: Oral  Weight: 146 lb 9.6 oz (66.5 kg)  Height: 5' 0.8" (1.544 m)   Body mass index is 27.88 kg/m.   Objective:  Physical Exam Constitutional:      Appearance: Normal appearance.  HENT:     Head: Normocephalic and atraumatic.  Cardiovascular:     Rate and Rhythm: Normal rate and regular rhythm.     Pulses: Normal pulses.     Heart sounds: Normal heart sounds. No murmur heard.   Pulmonary:     Effort: Pulmonary effort is normal. No respiratory distress.     Breath sounds: Normal breath sounds. No wheezing.  Skin:    Capillary Refill: Capillary refill takes less than 2 seconds.  Neurological:     Mental Status: She is alert.         Assessment And Plan:     1. Establishing care with new doctor, encounter for  -Patient is here to establish care. Micah Flesher over patient medical, family, social and surgical history. -Reviewed with patient their medications and any allergies  -Reviewed with patient their sexual orientation, drug/tobacco and alcohol use -Dicussed any new  concerns with patient  -recommended patient comes in for a physical exam and complete blood work.  -Educated patient about the importance of annual screenings and immunizations.  -Advised patient to eat a healthy diet along with exercise for atleast 30-45 min atleast 4-5 days of the week.   Follow Up: appt for physical exam    Patient was given opportunity to ask questions. Patient verbalized understanding of the plan and was able to repeat key elements of the plan. All questions were answered to their satisfaction.  Charlesetta Ivory, NP   I, Charlesetta Ivory, NP, have reviewed all  documentation for this visit. The documentation on 01/22/21 for the exam, diagnosis, procedures, and orders are all accurate and complete.   IF YOU HAVE BEEN REFERRED TO A SPECIALIST, IT MAY TAKE 1-2 WEEKS TO SCHEDULE/PROCESS THE REFERRAL. IF YOU HAVE NOT HEARD FROM US/SPECIALIST IN TWO WEEKS, PLEASE GIVE [place] A CALL AT [phone].   THE PATIENT IS ENCOURAGED TO PRACTICE SOCIAL DISTANCING DUE TO THE COVID-19 PANDEMIC.

## 2021-01-22 NOTE — Patient Instructions (Signed)

## 2021-02-18 ENCOUNTER — Encounter: Payer: Self-pay | Admitting: Internal Medicine

## 2021-02-18 LAB — HM PAP SMEAR: HM Pap smear: POSITIVE

## 2021-02-19 ENCOUNTER — Ambulatory Visit (INDEPENDENT_AMBULATORY_CARE_PROVIDER_SITE_OTHER): Payer: 59 | Admitting: Nurse Practitioner

## 2021-02-19 ENCOUNTER — Other Ambulatory Visit: Payer: Self-pay

## 2021-02-19 VITALS — BP 140/74 | HR 78 | Temp 97.7°F | Ht 63.8 in | Wt 148.8 lb

## 2021-02-19 DIAGNOSIS — E559 Vitamin D deficiency, unspecified: Secondary | ICD-10-CM

## 2021-02-19 DIAGNOSIS — Z0000 Encounter for general adult medical examination without abnormal findings: Secondary | ICD-10-CM

## 2021-02-19 DIAGNOSIS — I10 Essential (primary) hypertension: Secondary | ICD-10-CM

## 2021-02-19 DIAGNOSIS — Z1159 Encounter for screening for other viral diseases: Secondary | ICD-10-CM

## 2021-02-19 NOTE — Progress Notes (Signed)
I,Yamilka Roman [employer] as a Education administrator for [employer], NP.,have documented all relevant documentation on the behalf of [employer], NP,as directed by  Bary Castilla, NP while in the presence of Bary Castilla, NP. This visit occurred during the SARS-CoV-2 public health emergency.  Safety protocols were in place, including screening questions prior to the visit, additional usage of staff PPE, and extensive cleaning of exam room while observing appropriate contact time as indicated for disinfecting solutions.  Subjective:     Patient ID: Donna Barrera , female    DOB: 09-30-1970 , 50 y.o.   MRN: [id number]   Chief Complaint  Patient presents with  . Annual Exam    HPI  Patient is here for a physical exam. No other concerns.  Diet: She eats a lot of salad. Baked foods. Fish and salmon and pasta. She doesn't eat a lot of fried food.  Exercise: She exercising every single day. She walks on the treadmill. She does different things at home.  She does have a OBGYN. She recently got a paps smear done. She had a mammogram done last week. She does not drink or smoke.  Dentist every 7 months. Every year.   Wt Readings from Last 3 Encounters: 02/19/21 : 148 lb 12.8 oz (67.5 kg) 01/22/21 : 146 lb 9.6 oz (66.5 kg) 05/29/16 : 150 lb 3.2 oz (68.1 kg)     Past Medical History:  Diagnosis Date  . Hypertension      Family History  Problem Relation Age of Onset  . Breast cancer Mother   . Cancer Mother   . Hypertension Maternal Grandmother   . Cancer Maternal Grandmother      Current Outpatient Medications:  .  amLODipine (NORVASC) 2.5 MG tablet, Take 1 tablet (2.5 mg total) by mouth daily., Disp: 90 tablet, Rfl: 1 .  telmisartan (MICARDIS) 40 MG tablet, Take 1 tablet by mouth daily., Disp: , Rfl:    No Known Allergies    The patient states she uses for birth control. Last LMP was Patient's last menstrual period was 02/03/2021..  Negative for: breast  discharge, breast lump(s), breast pain and breast self exam. Associated symptoms include abnormal vaginal bleeding. Pertinent negatives include abnormal bleeding (hematology), anxiety, decreased libido, depression, difficulty falling sleep, dyspareunia, history of infertility, nocturia, sexual dysfunction, sleep disturbances, urinary incontinence, urinary urgency, vaginal discharge and vaginal itching. Diet regular.The patient states her exercise level is    . The patient's tobacco use is:  Social History   Tobacco Use  Smoking Status Never Smoker  Smokeless Tobacco Not on file  . She has been exposed to passive smoke. The patient's alcohol use is:  Social History   Substance and Sexual Activity  Alcohol Use No  Additional information: Last pap 02/18/21 next one scheduled for   Review of Systems  Constitutional: Negative.  Negative for diaphoresis, fatigue and fever.  HENT: Negative.  Negative for tinnitus.   Eyes: Negative.   Respiratory: Negative.  Negative for cough, shortness of breath and wheezing.   Cardiovascular: Negative.  Negative for chest pain and palpitations.  Gastrointestinal: Negative.  Negative for constipation and diarrhea.  Endocrine: Negative.   Genitourinary: Negative.  Negative for flank pain and urgency.  Musculoskeletal: Negative.  Negative for arthralgias and myalgias.  Skin: Negative.  Negative for rash.  Allergic/Immunologic: Negative.   Neurological: Negative.  Negative for light-headedness, numbness and headaches.  Hematological: Negative.   Psychiatric/Behavioral: Negative.      Today's Vitals   02/19/21 1446  BP: 140/74  Pulse: 78  Temp: 97.7 F (36.5 C)  Weight: 148 lb 12.8 oz (67.5 kg)  Height: 5' 3.8" (1.621 m)  PainSc: 0-No pain   Body mass index is 25.7 kg/m.  Wt Readings from Last 3 Encounters:  02/19/21 148 lb 12.8 oz (67.5 kg)  01/22/21 146 lb 9.6 oz (66.5 kg)  05/29/16 150 lb 3.2 oz (68.1 kg)    Objective:  Physical Exam Vitals  and nursing note reviewed.  Constitutional:      Appearance: Normal appearance.  HENT:     Head: Normocephalic and atraumatic.     Right Ear: Tympanic membrane, ear canal and external ear normal. There is no impacted cerumen.     Left Ear: Tympanic membrane, ear canal and external ear normal. There is no impacted cerumen.     Nose: Nose normal.     Mouth/Throat:     Mouth: Mucous membranes are moist.     Pharynx: Oropharynx is clear.  Eyes:     Extraocular Movements: Extraocular movements intact.     Conjunctiva/sclera: Conjunctivae normal.     Pupils: Pupils are equal, round, and reactive to light.  Cardiovascular:     Rate and Rhythm: Normal rate and regular rhythm.     Pulses: Normal pulses.     Heart sounds: Normal heart sounds.  Pulmonary:     Effort: Pulmonary effort is normal.     Breath sounds: Normal breath sounds.  Abdominal:     General: Abdomen is flat. Bowel sounds are normal.     Palpations: Abdomen is soft.  Genitourinary:    Comments: deferred Musculoskeletal:        General: Normal range of motion.     Cervical back: Normal range of motion and neck supple.  Skin:    General: Skin is warm and dry.  Neurological:     General: No focal deficit present.     Mental Status: She is alert and oriented to person, place, and time.  Psychiatric:        Mood and Affect: Mood normal.        Behavior: Behavior normal.         Assessment And Plan:     1. Encounter for general adult medical examination w/o abnormal findings --Patient is here for their annual physical exam and we discussed any changes to medication and medical history.  -Behavior modification was discussed as well as diet and exercise history  -Patient will continue to exercise regularly and modify their diet.  -Recommendation for yearly physical annuals, immunization and screenings including mammogram and colonoscopy were discussed with the patient.  -Recommended intake of multivitamin, vitamin D and  calcium.  -Individualized advise was given to the patient pertaining to their own health history in regards to diet, exercise, medical condition and referrals.  - CMP14+EGFR - CBC - Hemoglobin A1c - Lipid panel  2. Essential hypertension -Limit the intake of processed foods and salt intake. You should increase your intake of green vegetables and fruits. Limit the use of alcohol. Limit fast foods and fried foods. Avoid high fatty saturated and trans fat foods. Keep yourself hydrated with drinking water. Avoid red meats. Eat lean meats instead. Exercise for atleast 30-45 min for atleast 4-5 times a week.  - POCT Urinalysis Dipstick (81002) - POCT UA - Microalbumin - EKG 12-Lead - CMP14+EGFR  3. Vitamin D deficiency -Will check and supplement if needed. Advised patient to spend atleast 15 min. Daily in sunlight.  - VITAMIN  D 25 Hydroxy (Vit-D Deficiency, Fractures)  4. Encounter for hepatitis C screening test for low risk patient - Hepatitis C antibody  Staying healthy and adopting a healthy lifestyle for your overall health is important. You should eat 7 or more servings of fruits and vegetables per day. You should drink plenty of water to keep yourself hydrated and your kidneys healthy. This includes about 65-80+ fluid ounces of water. Limit your intake of animal fats especially for elevated cholesterol. Avoid highly processed food and limit your salt intake if you have hypertension. Avoid foods high in saturated/Trans fats. Along with a healthy diet it is also very important to maintain time for yourself to maintain a healthy mental health with low stress levels. You should get atleast 150 min of moderate intensity exercise weekly for a healthy heart. Along with eating right and exercising, aim for at least 7-9 hours of sleep daily.  Eat more whole grains which includes barley, wheat berries, oats, brown rice and whole wheat pasta. Use healthy plant oils which include olive, soy, corn, sunflower  and peanut. Limit your caffeine and sugary drinks. Limit your intake of fast foods. Limit milk and dairy products to one or two daily servings.   The patient was encouraged to call or send a message through [place] for any questions or concerns.   Patient was given opportunity to ask questions. Patient verbalized understanding of the plan and was able to repeat key elements of the plan. All questions were answered to their satisfaction.  Raman Maor Meckel, DNP   I, Raman Kalanie Fewell have reviewed all documentation for this visit. The documentation on 02/19/21 for the exam, diagnosis, procedures, and orders are all accurate and complete.    THE PATIENT IS ENCOURAGED TO PRACTICE SOCIAL DISTANCING DUE TO THE COVID-19 PANDEMIC.

## 2021-02-19 NOTE — Patient Instructions (Signed)
Health Maintenance, Female Adopting a healthy lifestyle and getting preventive care are important in promoting health and wellness. Ask your health care provider about:  The right schedule for you to have regular tests and exams.  Things you can do on your own to prevent diseases and keep yourself healthy. What should I know about diet, weight, and exercise? Eat a healthy diet  Eat a diet that includes plenty of vegetables, fruits, low-fat dairy products, and lean protein.  Do not eat a lot of foods that are high in solid fats, added sugars, or sodium.   Maintain a healthy weight Body mass index (BMI) is used to identify weight problems. It estimates body fat based on height and weight. Your health care provider can help determine your BMI and help you achieve or maintain a healthy weight. Get regular exercise Get regular exercise. This is one of the most important things you can do for your health. Most adults should:  Exercise for at least 150 minutes each week. The exercise should increase your heart rate and make you sweat (moderate-intensity exercise).  Do strengthening exercises at least twice a week. This is in addition to the moderate-intensity exercise.  Spend less time sitting. Even light physical activity can be beneficial. Watch cholesterol and blood lipids Have your blood tested for lipids and cholesterol at 52 years of age, then have this test every 5 years. Have your cholesterol levels checked more often if:  Your lipid or cholesterol levels are high.  You are older than 52 years of age.  You are at high risk for heart disease. What should I know about cancer screening? Depending on your health history and family history, you may need to have cancer screening at various ages. This may include screening for:  Breast cancer.  Cervical cancer.  Colorectal cancer.  Skin cancer.  Lung cancer. What should I know about heart disease, diabetes, and high blood  pressure? Blood pressure and heart disease  High blood pressure causes heart disease and increases the risk of stroke. This is more likely to develop in people who have high blood pressure readings, are of African descent, or are overweight.  Have your blood pressure checked: ? Every 3-5 years if you are 18-39 years of age. ? Every year if you are 40 years old or older. Diabetes Have regular diabetes screenings. This checks your fasting blood sugar level. Have the screening done:  Once every three years after age 40 if you are at a normal weight and have a low risk for diabetes.  More often and at a younger age if you are overweight or have a high risk for diabetes. What should I know about preventing infection? Hepatitis B If you have a higher risk for hepatitis B, you should be screened for this virus. Talk with your health care provider to find out if you are at risk for hepatitis B infection. Hepatitis C Testing is recommended for:  Everyone born from 1945 through 1965.  Anyone with known risk factors for hepatitis C. Sexually transmitted infections (STIs)  Get screened for STIs, including gonorrhea and chlamydia, if: ? You are sexually active and are younger than 52 years of age. ? You are older than 52 years of age and your health care provider tells you that you are at risk for this type of infection. ? Your sexual activity has changed since you were last screened, and you are at increased risk for chlamydia or gonorrhea. Ask your health care provider   if you are at risk.  Ask your health care provider about whether you are at high risk for HIV. Your health care provider may recommend a prescription medicine to help prevent HIV infection. If you choose to take medicine to prevent HIV, you should first get tested for HIV. You should then be tested every 3 months for as long as you are taking the medicine. Pregnancy  If you are about to stop having your period (premenopausal) and  you may become pregnant, seek counseling before you get pregnant.  Take 400 to 800 micrograms (mcg) of folic acid every day if you become pregnant.  Ask for birth control (contraception) if you want to prevent pregnancy. Osteoporosis and menopause Osteoporosis is a disease in which the bones lose minerals and strength with aging. This can result in bone fractures. If you are 65 years old or older, or if you are at risk for osteoporosis and fractures, ask your health care provider if you should:  Be screened for bone loss.  Take a calcium or vitamin D supplement to lower your risk of fractures.  Be given hormone replacement therapy (HRT) to treat symptoms of menopause. Follow these instructions at home: Lifestyle  Do not use any products that contain nicotine or tobacco, such as cigarettes, e-cigarettes, and chewing tobacco. If you need help quitting, ask your health care provider.  Do not use street drugs.  Do not share needles.  Ask your health care provider for help if you need support or information about quitting drugs. Alcohol use  Do not drink alcohol if: ? Your health care provider tells you not to drink. ? You are pregnant, may be pregnant, or are planning to become pregnant.  If you drink alcohol: ? Limit how much you use to 0-1 drink a day. ? Limit intake if you are breastfeeding.  Be aware of how much alcohol is in your drink. In the [place] one drink equals one 12 oz bottle of beer (355 mL), one 5 oz glass of wine (148 mL), or one 1 oz glass of hard liquor (44 mL). General instructions  Schedule regular health, dental, and eye exams.  Stay current with your vaccines.  Tell your health care provider if: ? You often feel depressed. ? You have ever been abused or do not feel safe at home. Summary  Adopting a healthy lifestyle and getting preventive care are important in promoting health and wellness.  Follow your health care provider's instructions about healthy  diet, exercising, and getting tested or screened for diseases.  Follow your health care provider's instructions on monitoring your cholesterol and blood pressure. This information is not intended to replace advice given to you by your health care provider. Make sure you discuss any questions you have with your health care provider. Document Revised: 09/07/2018 Document Reviewed: 09/07/2018 Elsevier Patient Education  2021 Elsevier Inc.  

## 2021-02-20 ENCOUNTER — Encounter: Payer: Self-pay | Admitting: Nurse Practitioner

## 2021-02-20 LAB — CMP14+EGFR
ALT: 8 IU/L (ref 0–32)
AST: 12 IU/L (ref 0–40)
Albumin/Globulin Ratio: 1.8 (ref 1.2–2.2)
Albumin: 4.9 g/dL (ref 3.8–4.9)
Alkaline Phosphatase: 55 IU/L (ref 44–121)
BUN/Creatinine Ratio: 14 (ref 9–23)
BUN: 12 mg/dL (ref 6–24)
Bilirubin Total: 0.3 mg/dL (ref 0.0–1.2)
CO2: 21 mmol/L (ref 20–29)
Calcium: 9.7 mg/dL (ref 8.7–10.2)
Chloride: 103 mmol/L (ref 96–106)
Creatinine, Ser: 0.85 mg/dL (ref 0.57–1.00)
Globulin, Total: 2.7 g/dL (ref 1.5–4.5)
Glucose: 79 mg/dL (ref 65–99)
Potassium: 3.8 mmol/L (ref 3.5–5.2)
Sodium: 139 mmol/L (ref 134–144)
Total Protein: 7.6 g/dL (ref 6.0–8.5)
eGFR: 83 mL/min/{1.73_m2} (ref >59)

## 2021-02-20 LAB — CBC
Hematocrit: 38.5 % (ref 34.0–46.6)
Hemoglobin: 12.8 g/dL (ref 11.1–15.9)
MCH: 28.8 pg (ref 26.6–33.0)
MCHC: 33.2 g/dL (ref 31.5–35.7)
MCV: 87 fL (ref 79–97)
Platelets: 207 10*3/uL (ref 150–450)
RBC: 4.45 x10E6/uL (ref 3.77–5.28)
RDW: 12.5 % (ref 11.7–15.4)
WBC: 6 10*3/uL (ref 3.4–10.8)

## 2021-02-20 LAB — HEPATITIS C ANTIBODY: Hep C Virus Ab: <0.1 s/co ratio (ref 0.0–0.9)

## 2021-02-20 LAB — LIPID PANEL
Chol/HDL Ratio: 2.9 ratio (ref 0.0–4.4)
Cholesterol, Total: 178 mg/dL (ref 100–199)
HDL: 62 mg/dL (ref >39)
LDL Chol Calc (NIH): 107 mg/dL — ABNORMAL HIGH (ref 0–99)
Triglycerides: 44 mg/dL (ref 0–149)
VLDL Cholesterol Cal: 9 mg/dL (ref 5–40)

## 2021-02-20 LAB — VITAMIN D 25 HYDROXY (VIT D DEFICIENCY, FRACTURES): Vit D, 25-Hydroxy: 16.9 ng/mL — ABNORMAL LOW (ref 30.0–100.0)

## 2021-02-20 LAB — HEMOGLOBIN A1C
Est. average glucose Bld gHb Est-mCnc: 103 mg/dL
Hgb A1c MFr Bld: 5.2 % (ref 4.8–5.6)

## 2021-02-20 MED ORDER — VITAMIN D (ERGOCALCIFEROL) 1.25 MG (50000 UNIT) PO CAPS: ORAL_CAPSULE | ORAL | 0 refills | Status: DC

## 2021-02-27 ENCOUNTER — Encounter: Payer: Self-pay | Admitting: Internal Medicine

## 2021-04-16 ENCOUNTER — Encounter: Payer: Self-pay | Admitting: Internal Medicine

## 2021-07-17 ENCOUNTER — Ambulatory Visit (INDEPENDENT_AMBULATORY_CARE_PROVIDER_SITE_OTHER): Payer: 59 | Admitting: Nurse Practitioner

## 2021-07-17 ENCOUNTER — Other Ambulatory Visit: Payer: Self-pay

## 2021-07-17 VITALS — BP 138/82 | HR 79 | Temp 98.7°F | Ht 63.8 in | Wt 150.0 lb

## 2021-07-17 DIAGNOSIS — I10 Essential (primary) hypertension: Secondary | ICD-10-CM

## 2021-07-17 DIAGNOSIS — Z2821 Immunization not carried out because of patient refusal: Secondary | ICD-10-CM

## 2021-07-17 DIAGNOSIS — E559 Vitamin D deficiency, unspecified: Secondary | ICD-10-CM | POA: Diagnosis not present

## 2021-07-17 MED ORDER — AMLODIPINE BESYLATE 2.5 MG PO TABS: 2.5000 mg | ORAL_TABLET | Freq: Every day | ORAL | 1 refills | Status: DC

## 2021-07-17 NOTE — Progress Notes (Signed)
I,Tianna Badgett,acting as a Education administrator for [employer], NP.,have documented all relevant documentation on the behalf of [employer], NP,as directed by  Bary Castilla, NP while in the presence of Bary Castilla, NP.  This visit occurred during the SARS-CoV-2 public health emergency.  Safety protocols were in place, including screening questions prior to the visit, additional usage of staff PPE, and extensive cleaning of exam room while observing appropriate contact time as indicated for disinfecting solutions.  Subjective:     Patient ID: Donna Barrera , female    DOB: [birth year] , 50 y.o.   MRN: [id number]   Chief Complaint  Patient presents with   Hypertension    HPI  Patient presents for HTN follow up. Her BP today was 138/82  LMP: 06/27/21  Diet: fruits and vegetarian and she its seafood and baked or grilled meats.  Exercise: She goes to the gym and walks.  She denies SOB, chest pain  She does not have any other concerns.    Hypertension Pertinent negatives include no chest pain, headaches, palpitations or shortness of breath.    Past Medical History:  Diagnosis Date   Hypertension      Family History  Problem Relation Age of Onset   Breast cancer Mother    Cancer Mother    Hypertension Maternal Grandmother    Cancer Maternal Grandmother      Current Outpatient Medications:    amLODipine (NORVASC) 2.5 MG tablet, Take 1 tablet (2.5 mg total) by mouth daily., Disp: 90 tablet, Rfl: 1   telmisartan (MICARDIS) 40 MG tablet, Take 1 tablet by mouth daily., Disp: , Rfl:    No Known Allergies   Review of Systems  Constitutional: Negative.  Negative for chills, fatigue and fever.  HENT:  Negative for congestion, postnasal drip, rhinorrhea and sneezing.   Respiratory: Negative.  Negative for cough and shortness of breath.   Cardiovascular: Negative.  Negative for chest pain and palpitations.  Gastrointestinal: Negative.  Negative for abdominal  distention, constipation, diarrhea and nausea.  Endocrine: Negative for polydipsia, polyphagia and polyuria.  Musculoskeletal:  Negative for arthralgias and myalgias.  Neurological: Negative.  Negative for weakness, numbness and headaches.    Today's Vitals   07/17/21 0851  BP: 138/82  Pulse: 79  Temp: 98.7 F (37.1 C)  TempSrc: Oral  Weight: 150 lb (68 kg)  Height: 5' 3.8" (1.621 m)   Body mass index is 25.91 kg/m.   Objective:  Physical Exam Constitutional:      Appearance: Normal appearance.  HENT:     Head: Normocephalic and atraumatic.  Cardiovascular:     Rate and Rhythm: Normal rate and regular rhythm.     Pulses: Normal pulses.     Heart sounds: Normal heart sounds. No murmur heard. Pulmonary:     Effort: Pulmonary effort is normal. No respiratory distress.     Breath sounds: Normal breath sounds. No wheezing.  Skin:    General: Skin is warm and dry.     Capillary Refill: Capillary refill takes less than 2 seconds.  Neurological:     Mental Status: She is alert and oriented to person, place, and time.        Assessment And Plan:     1. Essential hypertension -Chronic,stable  -Continue to take amlodipine (she needed for refills)  -Continue to take telmisartan 40 mg daily -Advised patient to check BP at home  -Limit the intake of processed foods and salt intake. You should increase your intake of green vegetables  and fruits. Limit the use of alcohol. Limit fast foods and fried foods. Avoid high fatty saturated and trans fat foods. Keep yourself hydrated with drinking water. Avoid red meats. Eat lean meats instead. Exercise for atleast 30-45 min for atleast 4-5 times a week.  - BMP8+eGFR - amLODipine (NORVASC) 2.5 MG tablet; Take 1 tablet (2.5 mg total) by mouth daily.  Dispense: 90 tablet; Refill: 1  2. Influenza vaccination declined -Patient was given risk and benefits of the flu vaccine.   3. Vitamin D deficiency -Will check vitamin D today and  assess. -Will check and supplement if needed. Advised patient to spend atleast 15 min. Daily in sunlight.  - Vitamin D (25 hydroxy)   The patient was encouraged to call or send a message through [place] for any questions or concerns.   Follow up: if symptoms persist or do not get better.   Side effects and appropriate use of all the medication(s) were discussed with the patient today. Patient advised to use the medication(s) as directed by their healthcare provider. The patient was encouraged to read, review, and understand all associated package inserts and contact our office with any questions or concerns. The patient accepts the risks of the treatment plan and had an opportunity to ask questions.   Patient was given opportunity to ask questions. Patient verbalized understanding of the plan and was able to repeat key elements of the plan. All questions were answered to their satisfaction.  Raman Jream Broyles, DNP   I, Raman Erickson Yamashiro have reviewed all documentation for this visit. The documentation on 07/17/21 for the exam, diagnosis, procedures, and orders are all accurate and complete.    IF YOU HAVE BEEN REFERRED TO A SPECIALIST, IT MAY TAKE 1-2 WEEKS TO SCHEDULE/PROCESS THE REFERRAL. IF YOU HAVE NOT HEARD FROM US/SPECIALIST IN TWO WEEKS, PLEASE GIVE [place] A CALL AT [phone].   THE PATIENT IS ENCOURAGED TO PRACTICE SOCIAL DISTANCING DUE TO THE COVID-19 PANDEMIC.

## 2021-07-17 NOTE — Patient Instructions (Signed)

## 2021-07-18 LAB — BMP8+EGFR
BUN/Creatinine Ratio: 22 (ref 9–23)
BUN: 18 mg/dL (ref 6–24)
CO2: 22 mmol/L (ref 20–29)
Calcium: 9.4 mg/dL (ref 8.7–10.2)
Chloride: 105 mmol/L (ref 96–106)
Creatinine, Ser: 0.83 mg/dL (ref 0.57–1.00)
Glucose: 103 mg/dL — ABNORMAL HIGH (ref 70–99)
Potassium: 4.8 mmol/L (ref 3.5–5.2)
Sodium: 139 mmol/L (ref 134–144)
eGFR: 85 mL/min/{1.73_m2} (ref >59)

## 2021-07-18 LAB — VITAMIN D 25 HYDROXY (VIT D DEFICIENCY, FRACTURES): Vit D, 25-Hydroxy: 29.6 ng/mL — ABNORMAL LOW (ref 30.0–100.0)

## 2021-07-22 ENCOUNTER — Other Ambulatory Visit: Payer: Self-pay | Admitting: Nurse Practitioner

## 2021-07-22 DIAGNOSIS — E559 Vitamin D deficiency, unspecified: Secondary | ICD-10-CM

## 2021-07-22 MED ORDER — VITAMIN D (ERGOCALCIFEROL) 1.25 MG (50000 UNIT) PO CAPS: 50000.0000 [IU] | ORAL_CAPSULE | ORAL | 0 refills | Status: DC

## 2021-10-14 ENCOUNTER — Ambulatory Visit (INDEPENDENT_AMBULATORY_CARE_PROVIDER_SITE_OTHER): Payer: Managed Care, Other (non HMO) | Admitting: Nurse Practitioner

## 2021-10-14 ENCOUNTER — Other Ambulatory Visit: Payer: Self-pay

## 2021-10-14 ENCOUNTER — Encounter: Payer: Self-pay | Admitting: Nurse Practitioner

## 2021-10-14 VITALS — BP 116/84 | HR 80 | Temp 98.3°F | Ht 63.8 in | Wt 144.8 lb

## 2021-10-14 DIAGNOSIS — Z6825 Body mass index (BMI) 25.0-25.9, adult: Secondary | ICD-10-CM | POA: Diagnosis not present

## 2021-10-14 DIAGNOSIS — I10 Essential (primary) hypertension: Secondary | ICD-10-CM | POA: Diagnosis not present

## 2021-10-14 DIAGNOSIS — E663 Overweight: Secondary | ICD-10-CM | POA: Diagnosis not present

## 2021-10-14 DIAGNOSIS — Z2821 Immunization not carried out because of patient refusal: Secondary | ICD-10-CM | POA: Diagnosis not present

## 2021-10-14 NOTE — Progress Notes (Signed)
I,Katawbba Wiggins,acting as a Education administrator for [employer], NP.,have documented all relevant documentation on the behalf of [employer], NP,as directed by  Bary Castilla, NP while in the presence of Bary Castilla, NP.  This visit occurred during the SARS-CoV-2 public health emergency.  Safety protocols were in place, including screening questions prior to the visit, additional usage of staff PPE, and extensive cleaning of exam room while observing appropriate contact time as indicated for disinfecting solutions.  Subjective:     Patient ID: Donna Barrera , female    DOB: 06/28/1971 , 51 y.o.   MRN: [id number]   Chief Complaint  Patient presents with   Hypertension    HPI  Patient presents for HTN follow up. She is doing well with her medications. She does not require any refills at this time. She denies SOB, chest pain or HA. She has no other concerns.  Diet: she is eating mostly vegetables, she is eating fish,  Exercise: She is getting exercise done.   Hypertension Pertinent negatives include no chest pain, headaches, palpitations or shortness of breath.    Past Medical History:  Diagnosis Date   Hypertension      Family History  Problem Relation Age of Onset   Breast cancer Mother    Cancer Mother    Hypertension Maternal Grandmother    Cancer Maternal Grandmother      Current Outpatient Medications:    amLODipine (NORVASC) 2.5 MG tablet, Take 1 tablet (2.5 mg total) by mouth daily., Disp: 90 tablet, Rfl: 1   telmisartan (MICARDIS) 40 MG tablet, Take 1 tablet by mouth daily., Disp: , Rfl:    Vitamin D, Ergocalciferol, (DRISDOL) 1.25 MG (50000 UNIT) CAPS capsule, Take 1 capsule (50,000 Units total) by mouth every 7 (seven) days. (Patient not taking: Reported on 10/14/2021), Disp: 12 capsule, Rfl: 0   No Known Allergies   Review of Systems  Constitutional: Negative.  Negative for fever.  Respiratory: Negative.  Negative for shortness of breath and  wheezing.   Cardiovascular: Negative.  Negative for chest pain and palpitations.  Gastrointestinal: Negative.   Neurological:  Negative for headaches.  Psychiatric/Behavioral: Negative.    All other systems reviewed and are negative.   Today's Vitals   10/14/21 0844  BP: 116/84  Pulse: 80  Temp: 98.3 F (36.8 C)  Weight: 144 lb 12.8 oz (65.7 kg)  Height: 5' 3.8" (1.621 m)   Body mass index is 25.01 kg/m.  Wt Readings from Last 3 Encounters:  10/14/21 144 lb 12.8 oz (65.7 kg)  07/17/21 150 lb (68 kg)  02/19/21 148 lb 12.8 oz (67.5 kg)    BP Readings from Last 3 Encounters:  10/14/21 116/84  07/17/21 138/82  02/19/21 140/74    Objective:  Physical Exam Constitutional:      Appearance: Normal appearance.  HENT:     Head: Normocephalic and atraumatic.  Cardiovascular:     Rate and Rhythm: Normal rate and regular rhythm.     Pulses: Normal pulses.     Heart sounds: Normal heart sounds. No murmur heard. Pulmonary:     Effort: Pulmonary effort is normal. No respiratory distress.     Breath sounds: Normal breath sounds. No wheezing.  Skin:    General: Skin is warm and dry.     Capillary Refill: Capillary refill takes less than 2 seconds.  Neurological:     Mental Status: She is alert.        Assessment And Plan:     1. Essential  hypertension -She is complaint with her medications -She is eating healthy limiting her intake of salt and processed foods  -She does not require any refills at this time.  -Will check her labs today  -Denies HA, SOB, or chest pain - BMP8+EGFR  2. COVID-19 vaccination declined -Education provided to patient about the known benefits of the vaccine.   3. Overweight with body mass index (BMI) of 25 to 25.9 in adult -Advised patient on a healthy diet including avoiding fast food and red meats. Increase the intake of lean meats including grilled chicken and [place].  Drink a lot of water. Decrease intake of fatty foods. Exercise for 30-45 min.  4-5 a week to decrease the risk of cardiac event.    The patient was encouraged to call or send a message through [place] for any questions or concerns.   Follow up: if symptoms persist or do not get better.   Side effects and appropriate use of all the medication(s) were discussed with the patient today. Patient advised to use the medication(s) as directed by their healthcare provider. The patient was encouraged to read, review, and understand all associated package inserts and contact our office with any questions or concerns. The patient accepts the risks of the treatment plan and had an opportunity to ask questions.   Patient was given opportunity to ask questions. Patient verbalized understanding of the plan and was able to repeat key elements of the plan. All questions were answered to their satisfaction.  Raman Tasman Zapata, DNP   I, Raman Clemens Lachman have reviewed all documentation for this visit. The documentation on 10/14/21 for the exam, diagnosis, procedures, and orders are all accurate and complete.    IF YOU HAVE BEEN REFERRED TO A SPECIALIST, IT MAY TAKE 1-2 WEEKS TO SCHEDULE/PROCESS THE REFERRAL. IF YOU HAVE NOT HEARD FROM US/SPECIALIST IN TWO WEEKS, PLEASE GIVE [place] A CALL AT [phone].   THE PATIENT IS ENCOURAGED TO PRACTICE SOCIAL DISTANCING DUE TO THE COVID-19 PANDEMIC.

## 2021-10-14 NOTE — Patient Instructions (Signed)
Cooking With Less Salt Cooking with less salt is one way to reduce the amount of sodium you get from food. Sodium is one of the elements that make up salt. It is found naturally in foods and is also added to certain foods. Depending on your condition and overall health, your health care provider or dietitian may recommend that you reduce your sodium intake. Most people should have less than 2,300 milligrams (mg) of sodium each day. If you have high blood pressure (hypertension), you may need to limit your sodium to 1,500 mg each day. Follow the tipsbelow to help reduce your sodium intake. What are tips for eating less sodium? Reading food labels  Check the food label before buying or using packaged ingredients. Always check the label for the serving size and sodium content. Look for products with no more than 140 mg of sodium in one serving. Check the % Daily Value column to see what percent of the daily recommended amount of sodium is provided in one serving of the product. Foods with 5% or less in this column are considered low in sodium. Foods with 20% or higher are considered high in sodium. Do not choose foods with salt as one of the first three ingredients on the ingredients list. If salt is one of the first three ingredients, it usually means the item is high in sodium.  Shopping Buy sodium-free or low-sodium products. Look for the following words on food labels: Low-sodium. Sodium-free. Reduced-sodium. No salt added. Unsalted. Always check the sodium content even if foods are labeled as low-sodium or no salt added. Buy fresh foods. Cooking Use herbs, seasonings without salt, and spices as substitutes for salt. Use sodium-free baking soda when baking. Grill, braise, or roast foods to add flavor with less salt. Avoid adding salt to pasta, rice, or hot cereals. Drain and rinse canned vegetables, beans, and meat before use. Avoid adding salt when cooking sweets and desserts. Cook with  low-sodium ingredients. What foods are high in sodium? Vegetables Regular canned vegetables (not low-sodium or reduced-sodium). Sauerkraut, pickled vegetables, and relishes. Olives. French fries. Onion rings. Regular canned tomato sauce and paste. Regular tomato and vegetable juice. Frozenvegetables in sauces. Grains Instant hot cereals. Bread stuffing, pancake, and biscuit mixes. Croutons. Seasoned rice or pasta mixes. Noodle soup cups. Boxed or frozen macaroni and cheese. Regular salted crackers. Self-rising flour. Rolls. Bagels. Flourtortillas and wraps. Meats and other proteins Meat or fish that is salted, canned, smoked, cured, spiced, or pickled. This includes bacon, ham, sausages, hot dogs, corned beef, chipped beef, meat loaves, salt pork, jerky, pickled herring, anchovies, regular canned tuna, andsardines. Salted nuts. Dairy Processed cheese and cheese spreads. Cheese curds. Blue cheese. Feta cheese.String cheese. Regular cottage cheese. Buttermilk. Canned milk. The items listed above may not be a complete list of foods high in sodium. Actual amounts of sodium may be different depending on processing. Contact a dietitian for more information. What foods are low in sodium? Fruits Fresh, frozen, or canned fruit with no sauce added. Fruit juice. Vegetables Fresh or frozen vegetables with no sauce added. "No salt added" canned vegetables. "No salt added" tomato sauce and paste. Low-sodium orreduced-sodium tomato and vegetable juice. Grains Noodles, pasta, quinoa, rice. Shredded or puffed wheat or puffed rice. Regular or quick oats (not instant). Low-sodium crackers. Low-sodium bread. Whole-grainbread and whole-grain pasta. Unsalted popcorn. Meats and other proteins Fresh or frozen whole meats, poultry (not injected with sodium), and fish with no sauce added. Unsalted nuts. Dried peas, beans, and   lentils without added salt. Unsalted canned beans. Eggs. Unsalted nut butters. Low-sodium canned  tunaor chicken. Dairy Milk. Soy milk. Yogurt. Low-sodium cheeses, such as Swiss, Monterey Jack, mozzarella, and ricotta. Sherbet or ice cream (keep to  cup per serving).Cream cheese. Fats and oils Unsalted butter or margarine. Other foods Homemade pudding. Sodium-free baking soda and baking powder. Herbs and spices.Low-sodium seasoning mixes. Beverages Coffee and tea. Carbonated beverages. The items listed above may not be a complete list of foods low in sodium. Actual amounts of sodium may be different depending on processing. Contact a dietitian for more information. What are some salt alternatives when cooking? The following are herbs, seasonings, and spices that can be used instead of salt to flavor your food. Herbs should be fresh or dried. Do not choose packaged mixes. Next to the name of the herb, spice, or seasoning aresome examples of foods you can pair it with. Herbs [place] leaves - Soups, meat and vegetable dishes, and spaghetti sauce. Basil - Italian dishes, soups, pasta, and fish dishes. Cilantro - Meat, poultry, and vegetable dishes. Chili powder - Marinades and Mexican dishes. Chives - Salad dressings and potato dishes. Cumin - Mexican dishes, couscous, and meat dishes. Dill - Fish dishes, sauces, and salads. Fennel - Meat and vegetable dishes, breads, and cookies. Garlic (do not use garlic salt) - Italian dishes, meat dishes, salad dressings, and sauces. Marjoram - Soups, potato dishes, and meat dishes. Oregano - Pizza and spaghetti sauce. Parsley - Salads, soups, pasta, and meat dishes. Rosemary - Italian dishes, salad dressings, soups, and red meats. Saffron - Fish dishes, pasta, and some poultry dishes. Sage - Stuffings and sauces. Tarragon - Fish and poultry dishes. Thyme - Stuffing, meat, and fish dishes. Seasonings Lemon juice - Fish dishes, poultry dishes, vegetables, and salads. Vinegar - Salad dressings, vegetables, and fish dishes. Spices Cinnamon - Sweet  dishes, such as cakes, cookies, and puddings. Cloves - Gingerbread, puddings, and marinades for meats. Curry - Vegetable dishes, fish and poultry dishes, and stir-fry dishes. Ginger - Vegetable dishes, fish dishes, and stir-fry dishes. Nutmeg - Pasta, vegetables, poultry, fish dishes, and custard. Summary Cooking with less salt is one way to reduce the amount of sodium that you get from food. Buy sodium-free or low-sodium products. Check the food label before using or buying packaged ingredients. Use herbs, seasonings without salt, and spices as substitutes for salt in foods. This information is not intended to replace advice given to you by your health care provider. Make sure you discuss any questions you have with your healthcare provider. Document Revised: 09/06/2019 Document Reviewed: 09/06/2019 Elsevier Patient Education  2022 [employer] Inc.  

## 2021-10-15 LAB — BMP8+EGFR
BUN/Creatinine Ratio: 20 (ref 9–23)
BUN: 16 mg/dL (ref 6–24)
CO2: 24 mmol/L (ref 20–29)
Calcium: 9.7 mg/dL (ref 8.7–10.2)
Chloride: 104 mmol/L (ref 96–106)
Creatinine, Ser: 0.82 mg/dL (ref 0.57–1.00)
Glucose: 81 mg/dL (ref 70–99)
Potassium: 4.5 mmol/L (ref 3.5–5.2)
Sodium: 140 mmol/L (ref 134–144)
eGFR: 86 mL/min/{1.73_m2} (ref >59)

## 2021-10-22 ENCOUNTER — Ambulatory Visit: Payer: 59 | Admitting: Nurse Practitioner

## 2022-02-16 ENCOUNTER — Encounter: Payer: Self-pay | Admitting: Nurse Practitioner

## 2022-02-16 ENCOUNTER — Ambulatory Visit (INDEPENDENT_AMBULATORY_CARE_PROVIDER_SITE_OTHER): Payer: Commercial Managed Care - HMO | Admitting: Nurse Practitioner

## 2022-02-16 ENCOUNTER — Other Ambulatory Visit: Payer: Self-pay

## 2022-02-16 VITALS — BP 120/66 | HR 84 | Temp 98.5°F | Ht 63.8 in | Wt 137.0 lb

## 2022-02-16 DIAGNOSIS — Z1211 Encounter for screening for malignant neoplasm of colon: Secondary | ICD-10-CM

## 2022-02-16 DIAGNOSIS — E7800 Pure hypercholesterolemia, unspecified: Secondary | ICD-10-CM

## 2022-02-16 DIAGNOSIS — E559 Vitamin D deficiency, unspecified: Secondary | ICD-10-CM

## 2022-02-16 DIAGNOSIS — I10 Essential (primary) hypertension: Secondary | ICD-10-CM | POA: Diagnosis not present

## 2022-02-16 MED ORDER — TELMISARTAN 40 MG PO TABS: 40.0000 mg | ORAL_TABLET | Freq: Every day | ORAL | 1 refills | Status: DC

## 2022-02-16 MED ORDER — AMLODIPINE BESYLATE 2.5 MG PO TABS: 2.5000 mg | ORAL_TABLET | Freq: Every day | ORAL | 1 refills | Status: DC

## 2022-02-16 NOTE — Progress Notes (Signed)
I,Tianna Badgett,acting as a Neurosurgeon for [employer], FNP.,have documented all relevant documentation on the behalf of Arnette Felts, FNP,as directed by  Arnette Felts, FNP while in the presence of Arnette Felts, FNP.  This visit occurred during the SARS-CoV-2 public health emergency.  Safety protocols were in place, including screening questions prior to the visit, additional usage of staff PPE, and extensive cleaning of exam room while observing appropriate contact time as indicated for disinfecting solutions.  Subjective:     Patient ID: Donna Barrera , female    DOB: 1971/06/26 , 51 y.o.   MRN: [id number]   Chief Complaint  Patient presents with   Hypertension    HPI  Patient presents today for HTN follow up. She is exercising 3-4 days a week except for when on menstrual cycle. LMP - 02/09/2022 - continues to be regular. She has had tubal ligation.   She has changed her eating to eat between 1p-7p with salad daily with a protein.   Wt Readings from Last 3 Encounters: 02/16/22 : 137 lb (62.1 kg) 10/14/21 : 144 lb 12.8 oz (65.7 kg) 07/17/21 : 150 lb (68 kg)    Hypertension This is a chronic problem. The current episode started more than 1 year ago. The problem has been gradually improving since onset. The problem is controlled. There are no known risk factors for coronary artery disease. Past treatments include calcium channel blockers and angiotensin blockers. There are no compliance problems.  There is no history of angina.    Past Medical History:  Diagnosis Date   Hypertension      Family History  Problem Relation Age of Onset   Breast cancer Mother    Cancer Mother    Hypertension Maternal Grandmother    Cancer Maternal Grandmother      Current Outpatient Medications:    telmisartan (MICARDIS) 40 MG tablet, Take 1 tablet (40 mg total) by mouth daily., Disp: 90 tablet, Rfl: 1   No Known Allergies   Review of Systems  Constitutional: Negative.   Respiratory:  Negative.    Cardiovascular: Negative.   Gastrointestinal: Negative.   Neurological: Negative.   Psychiatric/Behavioral: Negative.      Today's Vitals   02/16/22 1049  BP: 120/66  Pulse: 84  Temp: 98.5 F (36.9 C)  TempSrc: Oral  Weight: 137 lb (62.1 kg)  Height: 5' 3.8" (1.621 m)   Body mass index is 23.66 kg/m.   Objective:  Physical Exam Vitals reviewed.  Constitutional:      Appearance: Normal appearance. She is well-developed.  HENT:     Head: Normocephalic and atraumatic.  Eyes:     Pupils: Pupils are equal, round, and reactive to light.  Cardiovascular:     Rate and Rhythm: Normal rate and regular rhythm.     Pulses: Normal pulses.     Heart sounds: Normal heart sounds. No murmur heard. Pulmonary:     Effort: Pulmonary effort is normal.     Breath sounds: Normal breath sounds.  Musculoskeletal:        General: Normal range of motion.  Skin:    General: Skin is warm and dry.     Capillary Refill: Capillary refill takes less than 2 seconds.  Neurological:     General: No focal deficit present.     Mental Status: She is alert and oriented to person, place, and time.     Cranial Nerves: No cranial nerve deficit.  Psychiatric:        Mood and Affect:  Mood normal.        Assessment And Plan:     1. Essential hypertension Comments: Blood pressure controlled, it is reasonable to stop her amlodipine, will continue Telmisartan to avoid her blood pressure spiking.   2. Vitamin D deficiency Comments: Improved at her last visit, will check at her next visit with her physical  3. Elevated LDL cholesterol level Comments: Slightly elevated at the last visit, will recheck at her next visit  4. Encounter for screening colonoscopy Done 5 years ago, due for another colonoscopy due to family history, referral placed - Ambulatory referral to Gastroenterology     Patient was given opportunity to ask questions. Patient verbalized understanding of the plan and was able  to repeat key elements of the plan. All questions were answered to their satisfaction.  Arnette Felts, FNP   I, Arnette Felts, FNP, have reviewed all documentation for this visit. The documentation on 02/16/22 for the exam, diagnosis, procedures, and orders are all accurate and complete.   IF YOU HAVE BEEN REFERRED TO A SPECIALIST, IT MAY TAKE 1-2 WEEKS TO SCHEDULE/PROCESS THE REFERRAL. IF YOU HAVE NOT HEARD FROM US/SPECIALIST IN TWO WEEKS, PLEASE GIVE [place] A CALL AT [phone] X [phone].   THE PATIENT IS ENCOURAGED TO PRACTICE SOCIAL DISTANCING DUE TO THE COVID-19 PANDEMIC.

## 2022-02-16 NOTE — Patient Instructions (Signed)
Hypertension, Adult High blood pressure (hypertension) is when the force of blood pumping through the arteries is too strong. The arteries are the blood vessels that carry blood from the heart throughout the body. Hypertension forces the heart to work harder to pump blood and may cause arteries to become narrow or stiff. Untreated or uncontrolled hypertension can lead to a heart attack, heart failure, a stroke, kidney disease, and other problems. A blood pressure reading consists of a higher number over a lower number. Ideally, your blood pressure should be below 120/80. The first ("top") number is called the systolic pressure. It is a measure of the pressure in your arteries as your heart beats. The second ("bottom") number is called the diastolic pressure. It is a measure of the pressure in your arteries as the heart relaxes. What are the causes? The exact cause of this condition is not known. There are some conditions that result in high blood pressure. What increases the risk? Certain factors may make you more likely to develop high blood pressure. Some of these risk factors are under your control, including: Smoking. Not getting enough exercise or physical activity. Being overweight. Having too much fat, sugar, calories, or salt (sodium) in your diet. Drinking too much alcohol. Other risk factors include: Having a personal history of heart disease, diabetes, high cholesterol, or kidney disease. Stress. Having a family history of high blood pressure and high cholesterol. Having obstructive sleep apnea. Age. The risk increases with age. What are the signs or symptoms? High blood pressure may not cause symptoms. Very high blood pressure (hypertensive crisis) may cause: Headache. Fast or irregular heartbeats (palpitations). Shortness of breath. Nosebleed. Nausea and vomiting. Vision changes. Severe chest pain, dizziness, and seizures. How is this diagnosed? This condition is diagnosed by  measuring your blood pressure while you are seated, with your arm resting on a flat surface, your legs uncrossed, and your feet flat on the floor. The cuff of the blood pressure monitor will be placed directly against the skin of your upper arm at the level of your heart. Blood pressure should be measured at least twice using the same arm. Certain conditions can cause a difference in blood pressure between your right and left arms. If you have a high blood pressure reading during one visit or you have normal blood pressure with other risk factors, you may be asked to: Return on a different day to have your blood pressure checked again. Monitor your blood pressure at home for 1 week or longer. If you are diagnosed with hypertension, you may have other blood or imaging tests to help your health care provider understand your overall risk for other conditions. How is this treated? This condition is treated by making healthy lifestyle changes, such as eating healthy foods, exercising more, and reducing your alcohol intake. You may be referred for counseling on a healthy diet and physical activity. Your health care provider may prescribe medicine if lifestyle changes are not enough to get your blood pressure under control and if: Your systolic blood pressure is above 130. Your diastolic blood pressure is above 80. Your personal target blood pressure may vary depending on your medical conditions, your age, and other factors. Follow these instructions at home: Eating and drinking  Eat a diet that is high in fiber and potassium, and low in sodium, added sugar, and fat. An example of this eating plan is called the DASH diet. DASH stands for Dietary Approaches to Stop Hypertension. To eat this way: Eat   plenty of fresh fruits and vegetables. Try to fill one half of your plate at each meal with fruits and vegetables. Eat whole grains, such as whole-wheat pasta, brown rice, or whole-grain bread. Fill about one  fourth of your plate with whole grains. Eat or drink low-fat dairy products, such as skim milk or low-fat yogurt. Avoid fatty cuts of meat, processed or cured meats, and poultry with skin. Fill about one fourth of your plate with lean proteins, such as fish, chicken without skin, beans, eggs, or tofu. Avoid pre-made and processed foods. These tend to be higher in sodium, added sugar, and fat. Reduce your daily sodium intake. Many people with hypertension should eat less than 1,500 mg of sodium a day. Do not drink alcohol if: Your health care provider tells you not to drink. You are pregnant, may be pregnant, or are planning to become pregnant. If you drink alcohol: Limit how much you have to: 0-1 drink a day for women. 0-2 drinks a day for men. Know how much alcohol is in your drink. In the [place] one drink equals one 12 oz bottle of beer (355 mL), one 5 oz glass of wine (148 mL), or one 1 oz glass of hard liquor (44 mL). Lifestyle  Work with your health care provider to maintain a healthy body weight or to lose weight. Ask what an ideal weight is for you. Get at least 30 minutes of exercise that causes your heart to beat faster (aerobic exercise) most days of the week. Activities may include walking, swimming, or biking. Include exercise to strengthen your muscles (resistance exercise), such as Pilates or lifting weights, as part of your weekly exercise routine. Try to do these types of exercises for 30 minutes at least 3 days a week. Do not use any products that contain nicotine or tobacco. These products include cigarettes, chewing tobacco, and vaping devices, such as e-cigarettes. If you need help quitting, ask your health care provider. Monitor your blood pressure at home as told by your health care provider. Keep all follow-up visits. This is important. Medicines Take over-the-counter and prescription medicines only as told by your health care provider. Follow directions carefully. Blood  pressure medicines must be taken as prescribed. Do not skip doses of blood pressure medicine. Doing this puts you at risk for problems and can make the medicine less effective. Ask your health care provider about side effects or reactions to medicines that you should watch for. Contact a health care provider if you: Think you are having a reaction to a medicine you are taking. Have headaches that keep coming back (recurring). Feel dizzy. Have swelling in your ankles. Have trouble with your vision. Get help right away if you: Develop a severe headache or confusion. Have unusual weakness or numbness. Feel faint. Have severe pain in your chest or abdomen. Vomit repeatedly. Have trouble breathing. These symptoms may be an emergency. Get help right away. Call 911. Do not wait to see if the symptoms will go away. Do not drive yourself to the hospital. Summary Hypertension is when the force of blood pumping through your arteries is too strong. If this condition is not controlled, it may put you at risk for serious complications. Your personal target blood pressure may vary depending on your medical conditions, your age, and other factors. For most people, a normal blood pressure is less than 120/80. Hypertension is treated with lifestyle changes, medicines, or a combination of both. Lifestyle changes include losing weight, eating a healthy,   low-sodium diet, exercising more, and limiting alcohol. This information is not intended to replace advice given to you by your health care provider. Make sure you discuss any questions you have with your health care provider. Document Revised: 07/22/2021 Document Reviewed: 07/22/2021 Elsevier Patient Education  2023 Elsevier Inc.  

## 2022-02-25 ENCOUNTER — Encounter: Payer: 59 | Admitting: Nurse Practitioner

## 2022-03-19 ENCOUNTER — Other Ambulatory Visit: Payer: Self-pay | Admitting: Obstetrics & Gynecology

## 2022-03-19 DIAGNOSIS — N6452 Nipple discharge: Secondary | ICD-10-CM

## 2022-04-02 LAB — HM COLONOSCOPY

## 2022-04-12 ENCOUNTER — Ambulatory Visit
Admission: RE | Admit: 2022-04-12 | Discharge: 2022-04-12 | Disposition: A | Discharge status: Home / Self Care | Payer: Self-pay | Source: Ambulatory Visit | Attending: Obstetrics & Gynecology | Admitting: Obstetrics & Gynecology

## 2022-04-12 DIAGNOSIS — N6452 Nipple discharge: Secondary | ICD-10-CM

## 2022-04-12 MED ORDER — GADOBUTROL 1 MMOL/ML IV SOLN
6.0000 mL | Freq: Once | INTRAVENOUS | Status: AC | PRN
  Administered 2022-04-12: 6 mL via INTRAVENOUS

## 2022-04-13 ENCOUNTER — Encounter: Payer: Self-pay | Admitting: Nurse Practitioner

## 2022-04-13 ENCOUNTER — Ambulatory Visit (INDEPENDENT_AMBULATORY_CARE_PROVIDER_SITE_OTHER): Payer: Commercial Managed Care - HMO | Admitting: Nurse Practitioner

## 2022-04-13 VITALS — BP 120/62 | HR 79 | Temp 98.5°F | Ht 63.0 in | Wt 135.4 lb

## 2022-04-13 DIAGNOSIS — Z6823 Body mass index (BMI) 23.0-23.9, adult: Secondary | ICD-10-CM

## 2022-04-13 DIAGNOSIS — E559 Vitamin D deficiency, unspecified: Secondary | ICD-10-CM

## 2022-04-13 DIAGNOSIS — I10 Essential (primary) hypertension: Secondary | ICD-10-CM

## 2022-04-13 MED ORDER — TELMISARTAN 20 MG PO TABS: 20.0000 mg | ORAL_TABLET | Freq: Every day | ORAL | 0 refills | Status: DC

## 2022-04-13 NOTE — Patient Instructions (Signed)
Hypertension, Adult ?Hypertension is another name for high blood pressure. High blood pressure forces your heart to work harder to pump blood. This can cause problems over time. ?There are two numbers in a blood pressure reading. There is a top number (systolic) over a bottom number (diastolic). It is best to have a blood pressure that is below 120/80. ?What are the causes? ?The cause of this condition is not known. Some other conditions can lead to high blood pressure. ?What increases the risk? ?Some lifestyle factors can make you more likely to develop high blood pressure: ?Smoking. ?Not getting enough exercise or physical activity. ?Being overweight. ?Having too much fat, sugar, calories, or salt (sodium) in your diet. ?Drinking too much alcohol. ?Other risk factors include: ?Having any of these conditions: ?Heart disease. ?Diabetes. ?High cholesterol. ?Kidney disease. ?Obstructive sleep apnea. ?Having a family history of high blood pressure and high cholesterol. ?Age. The risk increases with age. ?Stress. ?What are the signs or symptoms? ?High blood pressure may not cause symptoms. Very high blood pressure (hypertensive crisis) may cause: ?Headache. ?Fast or uneven heartbeats (palpitations). ?Shortness of breath. ?Nosebleed. ?Vomiting or feeling like you may vomit (nauseous). ?Changes in how you see. ?Very bad chest pain. ?Feeling dizzy. ?Seizures. ?How is this treated? ?This condition is treated by making healthy lifestyle changes, such as: ?Eating healthy foods. ?Exercising more. ?Drinking less alcohol. ?Your doctor may prescribe medicine if lifestyle changes do not help enough and if: ?Your top number is above 130. ?Your bottom number is above 80. ?Your personal target blood pressure may vary. ?Follow these instructions at home: ?Eating and drinking ? ?If told, follow the DASH eating plan. To follow this plan: ?Fill one half of your plate at each meal with fruits and vegetables. ?Fill one fourth of your plate  at each meal with whole grains. Whole grains include whole-wheat pasta, brown rice, and whole-grain bread. ?Eat or drink low-fat dairy products, such as skim milk or low-fat yogurt. ?Fill one fourth of your plate at each meal with low-fat (lean) proteins. Low-fat proteins include fish, chicken without skin, eggs, beans, and tofu. ?Avoid fatty meat, cured and processed meat, or chicken with skin. ?Avoid pre-made or processed food. ?Limit the amount of salt in your diet to less than 1,500 mg each day. ?Do not drink alcohol if: ?Your doctor tells you not to drink. ?You are pregnant, may be pregnant, or are planning to become pregnant. ?If you drink alcohol: ?Limit how much you have to: ?0-1 drink a day for women. ?0-2 drinks a day for men. ?Know how much alcohol is in your drink. In the [place] one drink equals one 12 oz bottle of beer (355 mL), one 5 oz glass of wine (148 mL), or one 1? oz glass of hard liquor (44 mL). ?Lifestyle ? ?Work with your doctor to stay at a healthy weight or to lose weight. Ask your doctor what the best weight is for you. ?Get at least 30 minutes of exercise that causes your heart to beat faster (aerobic exercise) most days of the week. This may include walking, swimming, or biking. ?Get at least 30 minutes of exercise that strengthens your muscles (resistance exercise) at least 3 days a week. This may include lifting weights or doing Pilates. ?Do not smoke or use any products that contain nicotine or tobacco. If you need help quitting, ask your doctor. ?Check your blood pressure at home as told by your doctor. ?Keep all follow-up visits. ?Medicines ?Take over-the-counter and prescription medicines   only as told by your doctor. Follow directions carefully. ?Do not skip doses of blood pressure medicine. The medicine does not work as well if you skip doses. Skipping doses also puts you at risk for problems. ?Ask your doctor about side effects or reactions to medicines that you should watch  for. ?Contact a doctor if: ?You think you are having a reaction to the medicine you are taking. ?You have headaches that keep coming back. ?You feel dizzy. ?You have swelling in your ankles. ?You have trouble with your vision. ?Get help right away if: ?You get a very bad headache. ?You start to feel mixed up (confused). ?You feel weak or numb. ?You feel faint. ?You have very bad pain in your: ?Chest. ?Belly (abdomen). ?You vomit more than once. ?You have trouble breathing. ?These symptoms may be an emergency. Get help right away. Call 911. ?Do not wait to see if the symptoms will go away. ?Do not drive yourself to the hospital. ?Summary ?Hypertension is another name for high blood pressure. ?High blood pressure forces your heart to work harder to pump blood. ?For most people, a normal blood pressure is less than 120/80. ?Making healthy choices can help lower blood pressure. If your blood pressure does not get lower with healthy choices, you may need to take medicine. ?This information is not intended to replace advice given to you by your health care provider. Make sure you discuss any questions you have with your health care provider. ?Document Revised: 07/03/2021 Document Reviewed: 07/03/2021 ?Elsevier Patient Education ? 2023 Elsevier Inc. ? ?

## 2022-04-13 NOTE — Progress Notes (Signed)
Barnet Glasgow Martin,acting as a Education administrator for Minette Brine, FNP.,have documented all relevant documentation on the behalf of Minette Brine, FNP,as directed by  Minette Brine, FNP while in the presence of Minette Brine, [place].    Subjective:     Patient ID: Donna Barrera , female    DOB: 03-12-1971 , 51 y.o.   MRN: [id number]   Chief Complaint  Patient presents with   Hypertension    HPI  Patient presents today for bp check. Patient has no other issues. She is interested to stop her telmisartan. During the time she started there were several deaths in the family, she thinks more than 5 years. She is exercising daily at least 1-1.5 hours. She has cut out her meat intake.  BP Readings from Last 3 Encounters: 04/13/22 120/62 02/16/22 120/66 10/14/21 116/84   Hypertension This is a chronic problem. The current episode started more than 1 year ago. The problem is controlled. Pertinent negatives include no anxiety, chest pain, headaches, palpitations or shortness of breath. There are no associated agents to hypertension. There are no known risk factors for coronary artery disease. Past treatments include angiotensin blockers. There are no compliance problems.  There is no history of angina. There is no history of chronic renal disease.     Past Medical History:  Diagnosis Date   Hypertension      Family History  Problem Relation Age of Onset   Breast cancer Mother    Cancer Mother    Hypertension Maternal Grandmother    Cancer Maternal Grandmother      Current Outpatient Medications:    telmisartan (MICARDIS) 20 MG tablet, Take 1 tablet (20 mg total) by mouth daily., Disp: 90 tablet, Rfl: 0   No Known Allergies   Review of Systems  Constitutional: Negative.  Negative for activity change and fatigue.  Eyes:  Negative for visual disturbance.  Respiratory: Negative.  Negative for choking, shortness of breath and wheezing.   Cardiovascular: Negative.  Negative for chest pain,  palpitations and leg swelling.  Gastrointestinal: Negative.   Endocrine: Negative.  Negative for polydipsia, polyphagia and polyuria.  Musculoskeletal: Negative.   Skin: Negative.   Neurological:  Negative for dizziness, weakness and headaches.  Psychiatric/Behavioral: Negative.  Negative for confusion. The patient is not nervous/anxious.      Today's Vitals   04/13/22 0906  BP: 120/62  Pulse: 79  Temp: 98.5 F (36.9 C)  TempSrc: Oral  Weight: 135 lb 6.4 oz (61.4 kg)  Height: 5' 3"  (1.6 m)  PainSc: 0-No pain   Body mass index is 23.99 kg/m.  Wt Readings from Last 3 Encounters:  04/13/22 135 lb 6.4 oz (61.4 kg)  02/16/22 137 lb (62.1 kg)  10/14/21 144 lb 12.8 oz (65.7 kg)    Objective:  Physical Exam Vitals reviewed.  Constitutional:      General: She is not in acute distress.    Appearance: Normal appearance. She is well-developed.  HENT:     Head: Normocephalic and atraumatic.  Eyes:     Pupils: Pupils are equal, round, and reactive to light.  Cardiovascular:     Rate and Rhythm: Normal rate and regular rhythm.     Pulses: Normal pulses.     Heart sounds: Normal heart sounds. No murmur heard. Pulmonary:     Effort: Pulmonary effort is normal. No respiratory distress.     Breath sounds: Normal breath sounds. No wheezing.  Skin:    General: Skin is warm and dry.  Capillary Refill: Capillary refill takes less than 2 seconds.  Neurological:     General: No focal deficit present.     Mental Status: She is alert and oriented to person, place, and time.     Cranial Nerves: No cranial nerve deficit.     Motor: No weakness.  Psychiatric:        Mood and Affect: Mood normal.        Behavior: Behavior normal.        Thought Content: Thought content normal.        Judgment: Judgment normal.         Assessment And Plan:     1. Essential hypertension Comments: Blood pressure is consistently controlled, will decrease telmisartan to 20 mg if remains normal will  discontinue at next visit.  - BMP8+eGFR  2. Vitamin D deficiency Will check vitamin D level and supplement as needed.    Also encouraged to spend 15 minutes in the sun daily.  - Vitamin D (25 hydroxy)  3. BMI 23.0-23.9, adult She is doing great with her weight loss, continue healthy diet     Patient was given opportunity to ask questions. Patient verbalized understanding of the plan and was able to repeat key elements of the plan. All questions were answered to their satisfaction.  Minette Brine, FNP   I, Minette Brine, FNP, have reviewed all documentation for this visit. The documentation on 04/13/22 for the exam, diagnosis, procedures, and orders are all accurate and complete.   IF YOU HAVE BEEN REFERRED TO A SPECIALIST, IT MAY TAKE 1-2 WEEKS TO SCHEDULE/PROCESS THE REFERRAL. IF YOU HAVE NOT HEARD FROM US/SPECIALIST IN TWO WEEKS, PLEASE GIVE [place] A CALL AT [phone] X [phone].   THE PATIENT IS ENCOURAGED TO PRACTICE SOCIAL DISTANCING DUE TO THE COVID-19 PANDEMIC.

## 2022-04-20 ENCOUNTER — Other Ambulatory Visit: Payer: Self-pay | Admitting: Obstetrics & Gynecology

## 2022-04-20 DIAGNOSIS — R9389 Abnormal findings on diagnostic imaging of other specified body structures: Secondary | ICD-10-CM

## 2022-05-15 ENCOUNTER — Other Ambulatory Visit: Payer: Self-pay | Admitting: Obstetrics & Gynecology

## 2022-05-15 DIAGNOSIS — R928 Other abnormal and inconclusive findings on diagnostic imaging of breast: Secondary | ICD-10-CM

## 2022-05-18 ENCOUNTER — Ambulatory Visit
Admission: RE | Admit: 2022-05-18 | Discharge: 2022-05-18 | Disposition: A | Discharge status: Home / Self Care | Payer: Commercial Managed Care - HMO | Source: Ambulatory Visit | Attending: Obstetrics & Gynecology | Admitting: Obstetrics & Gynecology

## 2022-05-18 DIAGNOSIS — R9389 Abnormal findings on diagnostic imaging of other specified body structures: Secondary | ICD-10-CM

## 2022-05-18 MED ORDER — GADOBUTROL 1 MMOL/ML IV SOLN
7.0000 mL | Freq: Once | INTRAVENOUS | Status: AC | PRN
  Administered 2022-05-18: 7 mL via INTRAVENOUS

## 2022-05-19 ENCOUNTER — Other Ambulatory Visit: Payer: Self-pay | Admitting: Obstetrics & Gynecology

## 2022-05-19 ENCOUNTER — Ambulatory Visit
Admission: RE | Admit: 2022-05-19 | Discharge: 2022-05-19 | Disposition: A | Discharge status: Home / Self Care | Payer: Commercial Managed Care - HMO | Source: Ambulatory Visit | Attending: Obstetrics & Gynecology | Admitting: Obstetrics & Gynecology

## 2022-05-19 DIAGNOSIS — R928 Other abnormal and inconclusive findings on diagnostic imaging of breast: Secondary | ICD-10-CM

## 2022-05-20 ENCOUNTER — Other Ambulatory Visit: Payer: Self-pay | Admitting: Obstetrics & Gynecology

## 2022-05-20 DIAGNOSIS — R9389 Abnormal findings on diagnostic imaging of other specified body structures: Secondary | ICD-10-CM

## 2022-05-21 ENCOUNTER — Ambulatory Visit: Payer: Commercial Managed Care - HMO | Admitting: Nurse Practitioner

## 2022-05-29 ENCOUNTER — Ambulatory Visit
Admission: RE | Admit: 2022-05-29 | Discharge: 2022-05-29 | Disposition: A | Discharge status: Home / Self Care | Payer: Commercial Managed Care - HMO | Source: Ambulatory Visit | Attending: Obstetrics & Gynecology | Admitting: Obstetrics & Gynecology

## 2022-05-29 DIAGNOSIS — R9389 Abnormal findings on diagnostic imaging of other specified body structures: Secondary | ICD-10-CM

## 2022-05-29 MED ORDER — GADOBUTROL 1 MMOL/ML IV SOLN
6.0000 mL | Freq: Once | INTRAVENOUS | Status: AC | PRN
  Administered 2022-05-29: 6 mL via INTRAVENOUS

## 2022-06-28 ENCOUNTER — Other Ambulatory Visit: Payer: Self-pay | Admitting: Nurse Practitioner

## 2022-06-28 DIAGNOSIS — I10 Essential (primary) hypertension: Secondary | ICD-10-CM

## 2022-08-27 ENCOUNTER — Ambulatory Visit (INDEPENDENT_AMBULATORY_CARE_PROVIDER_SITE_OTHER): Payer: Commercial Managed Care - HMO | Admitting: Nurse Practitioner

## 2022-08-27 ENCOUNTER — Encounter: Payer: Self-pay | Admitting: Nurse Practitioner

## 2022-08-27 VITALS — BP 134/82 | HR 82 | Temp 98.5°F | Ht 63.0 in | Wt 133.0 lb

## 2022-08-27 DIAGNOSIS — I10 Essential (primary) hypertension: Secondary | ICD-10-CM

## 2022-08-27 DIAGNOSIS — H6123 Impacted cerumen, bilateral: Secondary | ICD-10-CM

## 2022-08-27 DIAGNOSIS — Z0000 Encounter for general adult medical examination without abnormal findings: Secondary | ICD-10-CM | POA: Diagnosis not present

## 2022-08-27 DIAGNOSIS — E7800 Pure hypercholesterolemia, unspecified: Secondary | ICD-10-CM

## 2022-08-27 DIAGNOSIS — E559 Vitamin D deficiency, unspecified: Secondary | ICD-10-CM

## 2022-08-27 DIAGNOSIS — Z79899 Other long term (current) drug therapy: Secondary | ICD-10-CM

## 2022-08-27 DIAGNOSIS — Z2821 Immunization not carried out because of patient refusal: Secondary | ICD-10-CM

## 2022-08-27 LAB — POCT URINALYSIS DIPSTICK
Bilirubin, UA: NEGATIVE
Blood, UA: NEGATIVE
Glucose, UA: NEGATIVE
Ketones, UA: NEGATIVE
Leukocytes, UA: NEGATIVE
Nitrite, UA: NEGATIVE
Protein, UA: NEGATIVE
Spec Grav, UA: 1.015 (ref 1.010–1.025)
Urobilinogen, UA: 0.2 E.U./dL
pH, UA: 6 (ref 5.0–8.0)

## 2022-08-27 NOTE — Progress Notes (Signed)
I,Tianna Badgett,acting as a Education administrator for [employer], FNP.,have documented all relevant documentation on the behalf of Minette Brine, FNP,as directed by  Minette Brine, FNP while in the presence of Minette Brine, [place].  Subjective:     Patient ID: Donna Barrera , female    DOB: 23-Apr-1971 , 51 y.o.   MRN: [id number]   Chief Complaint  Patient presents with   Annual Exam    HPI  Patient is here for a physical exam. No other concerns.      Past Medical History:  Diagnosis Date   Hypertension      Family History  Problem Relation Age of Onset   Breast cancer Mother    Cancer Mother    Hypertension Maternal Grandmother    Cancer Maternal Grandmother      Current Outpatient Medications:    telmisartan (MICARDIS) 20 MG tablet, TAKE 1 TABLET BY MOUTH EVERY DAY, Disp: 30 tablet, Rfl: 2   No Known Allergies    The patient states she is tubal ligation for birth control.  Patient's last menstrual period was 08/10/2022 (exact date).  Negative for Dysmenorrhea and Negative for Menorrhagia. Negative for: breast discharge, breast lump(s), breast pain and breast self exam. Associated symptoms include abnormal vaginal bleeding. Pertinent negatives include abnormal bleeding (hematology), anxiety, decreased libido, depression, difficulty falling sleep, dyspareunia, history of infertility, nocturia, sexual dysfunction, sleep disturbances, urinary incontinence, urinary urgency, vaginal discharge and vaginal itching. Diet regular - she has eliminated pork and rarely eats beef. Eats more fish, chicken and [place]. She drinks 4 (16 oz) bottles of water daily. The patient states her exercise level is moderate - 4 days a week 1.5 hours each time.   The patient's tobacco use is:  Social History   Tobacco Use  Smoking Status Never  Smokeless Tobacco Not on file   She has been exposed to passive smoke. The patient's alcohol use is:  Social History   Substance and Sexual Activity  Alcohol  Use No   Additional information: Last pap 2023, next one scheduled for 2025 but every 2 years.    Review of Systems  Constitutional: Negative.   HENT: Negative.    Eyes: Negative.   Respiratory: Negative.    Cardiovascular: Negative.   Gastrointestinal: Negative.   Endocrine: Negative.   Genitourinary: Negative.   Musculoskeletal: Negative.   Skin: Negative.   Allergic/Immunologic: Negative.   Neurological: Negative.   Hematological: Negative.   Psychiatric/Behavioral: Negative.       Today's Vitals   08/27/22 1416  BP: 134/82  Pulse: 82  Temp: 98.5 F (36.9 C)  TempSrc: Oral  Weight: 133 lb (60.3 kg)  Height: _0  (1.6 m)   Body mass index is 23.56 kg/m.   Objective:  Physical Exam Vitals reviewed.  Constitutional:      General: She is not in acute distress.    Appearance: Normal appearance. She is well-developed. She is obese.  HENT:     Head: Normocephalic and atraumatic.     Right Ear: Hearing and external ear normal. There is impacted cerumen.     Left Ear: Hearing and external ear normal. There is impacted cerumen.     Nose:     Comments: Deferred - masked    Mouth/Throat:     Comments: Deferred - masked Eyes:     General: Lids are normal.     Extraocular Movements: Extraocular movements intact.     Conjunctiva/sclera: Conjunctivae normal.     Pupils: Pupils are equal, round,  and reactive to light.     Funduscopic exam:    Right eye: No papilledema.        Left eye: No papilledema.  Neck:     Thyroid: No thyroid mass.     Vascular: No carotid bruit.  Cardiovascular:     Rate and Rhythm: Normal rate and regular rhythm.     Pulses: Normal pulses.     Heart sounds: Normal heart sounds. No murmur heard. Pulmonary:     Effort: Pulmonary effort is normal. No respiratory distress.     Breath sounds: Normal breath sounds. No wheezing.  Chest:     Chest wall: No mass.  Breasts:    Tanner Score is 5.     Right: Normal. No mass or tenderness.      Left: Normal. No mass or tenderness.  Abdominal:     General: Abdomen is flat. Bowel sounds are normal. There is no distension.     Palpations: Abdomen is soft.     Tenderness: There is no abdominal tenderness.  Genitourinary:    Rectum: Guaiac result negative.  Musculoskeletal:        General: No swelling. Normal range of motion.     Cervical back: Full passive range of motion without pain, normal range of motion and neck supple.     Right lower leg: No edema.     Left lower leg: No edema.  Lymphadenopathy:     Upper Body:     Right upper body: No supraclavicular, axillary or pectoral adenopathy.     Left upper body: No supraclavicular, axillary or pectoral adenopathy.  Skin:    General: Skin is warm and dry.     Capillary Refill: Capillary refill takes less than 2 seconds.  Neurological:     General: No focal deficit present.     Mental Status: She is alert and oriented to person, place, and time.     Cranial Nerves: No cranial nerve deficit.     Sensory: No sensory deficit.     Motor: No weakness.  Psychiatric:        Mood and Affect: Mood normal.        Behavior: Behavior normal.        Thought Content: Thought content normal.        Judgment: Judgment normal.         Assessment And Plan:     1. Encounter for general adult medical examination w/o abnormal findings Behavior modifications discussed and diet history reviewed.   Pt will continue to exercise regularly and modify diet with low GI, plant based foods and decrease intake of processed foods.  Recommend intake of daily multivitamin, Vitamin D, and calcium.  Recommend mammogram and colonoscopy for preventive screenings, as well as recommend immunizations that include influenza, TDAP, and Shingles (decline)  2. Essential hypertension Comments: Blood pressure is fairly controlled. Continue current medications. EKG done with NSR HR 60 - POCT Urinalysis Dipstick (81002) - Microalbumin / Creatinine Urine Ratio - EKG  12-Lead - CMP14+EGFR  3. Vitamin D deficiency Will check vitamin D level and supplement as needed.    Also encouraged to spend 15 minutes in the sun daily.  - VITAMIN D 25 Hydroxy (Vit-D Deficiency, Fractures)  4. Elevated LDL cholesterol level Comments: Diet controlled, continue low fat diet. - Lipid panel  5. Influenza vaccination declined Patient declined influenza vaccination at this time. Patient is aware that influenza vaccine prevents illness in 70% of healthy people, and reduces hospitalizations to  30-70% in elderly. This vaccine is recommended annually. Education has been provided regarding the importance of this vaccine but patient still declined. Advised may receive this vaccine at local pharmacy or Health Dept.or vaccine clinic. Aware to provide a copy of the vaccination record if obtained from local pharmacy or Health Dept.  Pt is willing to accept risk associated with refusing vaccination.  6. Herpes zoster vaccination declined Declines shingrix, educated on disease process and is aware if he changes his mind to notify office  7. Tetanus, diphtheria, and acellular pertussis (Tdap) vaccination declined  8. Other long term (current) drug therapy - CBC  9. Bilateral impacted cerumen Comments: will clean ears at home with 1/2 water and 1/2 peroxide   Patient was given opportunity to ask questions. Patient verbalized understanding of the plan and was able to repeat key elements of the plan. All questions were answered to their satisfaction.   Minette Brine, FNP   I, Minette Brine, FNP, have reviewed all documentation for this visit. The documentation on 08/27/22 for the exam, diagnosis, procedures, and orders are all accurate and complete.   THE PATIENT IS ENCOURAGED TO PRACTICE SOCIAL DISTANCING DUE TO THE COVID-19 PANDEMIC.

## 2022-08-27 NOTE — Patient Instructions (Addendum)
Health Maintenance, Female Adopting a healthy lifestyle and getting preventive care are important in promoting health and wellness. Ask your health care provider about: The right schedule for you to have regular tests and exams. Things you can do on your own to prevent diseases and keep yourself healthy. What should I know about diet, weight, and exercise? Eat a healthy diet  Eat a diet that includes plenty of vegetables, fruits, low-fat dairy products, and lean protein. Do not eat a lot of foods that are high in solid fats, added sugars, or sodium. Maintain a healthy weight Body mass index (BMI) is used to identify weight problems. It estimates body fat based on height and weight. Your health care provider can help determine your BMI and help you achieve or maintain a healthy weight. Get regular exercise Get regular exercise. This is one of the most important things you can do for your health. Most adults should: Exercise for at least 150 minutes each week. The exercise should increase your heart rate and make you sweat (moderate-intensity exercise). Do strengthening exercises at least twice a week. This is in addition to the moderate-intensity exercise. Spend less time sitting. Even light physical activity can be beneficial. Watch cholesterol and blood lipids Have your blood tested for lipids and cholesterol at 53 years of age, then have this test every 5 years. Have your cholesterol levels checked more often if: Your lipid or cholesterol levels are high. You are older than 53 years of age. You are at high risk for heart disease. What should I know about cancer screening? Depending on your health history and family history, you may need to have cancer screening at various ages. This may include screening for: Breast cancer. Cervical cancer. Colorectal cancer. Skin cancer. Lung cancer. What should I know about heart disease, diabetes, and high blood pressure? Blood pressure and heart  disease High blood pressure causes heart disease and increases the risk of stroke. This is more likely to develop in people who have high blood pressure readings or are overweight. Have your blood pressure checked: Every 3-5 years if you are 18-39 years of age. Every year if you are 40 years old or older. Diabetes Have regular diabetes screenings. This checks your fasting blood sugar level. Have the screening done: Once every three years after age 40 if you are at a normal weight and have a low risk for diabetes. More often and at a younger age if you are overweight or have a high risk for diabetes. What should I know about preventing infection? Hepatitis B If you have a higher risk for hepatitis B, you should be screened for this virus. Talk with your health care provider to find out if you are at risk for hepatitis B infection. Hepatitis C Testing is recommended for: Everyone born from 1945 through 1965. Anyone with known risk factors for hepatitis C. Sexually transmitted infections (STIs) Get screened for STIs, including gonorrhea and chlamydia, if: You are sexually active and are younger than 53 years of age. You are older than 53 years of age and your health care provider tells you that you are at risk for this type of infection. Your sexual activity has changed since you were last screened, and you are at increased risk for chlamydia or gonorrhea. Ask your health care provider if you are at risk. Ask your health care provider about whether you are at high risk for HIV. Your health care provider may recommend a prescription medicine to help prevent HIV   infection. If you choose to take medicine to prevent HIV, you should first get tested for HIV. You should then be tested every 3 months for as long as you are taking the medicine. Pregnancy If you are about to stop having your period (premenopausal) and you may become pregnant, seek counseling before you get pregnant. Take 400 to 800  micrograms (mcg) of folic acid every day if you become pregnant. Ask for birth control (contraception) if you want to prevent pregnancy. Osteoporosis and menopause Osteoporosis is a disease in which the bones lose minerals and strength with aging. This can result in bone fractures. If you are 27 years old or older, or if you are at risk for osteoporosis and fractures, ask your health care provider if you should: Be screened for bone loss. Take a calcium or vitamin D supplement to lower your risk of fractures. Be given hormone replacement therapy (HRT) to treat symptoms of menopause. Follow these instructions at home: Alcohol use Do not drink alcohol if: Your health care provider tells you not to drink. You are pregnant, may be pregnant, or are planning to become pregnant. If you drink alcohol: Limit how much you have to: 0-1 drink a day. Know how much alcohol is in your drink. In the [place] one drink equals one 12 oz bottle of beer (355 mL), one 5 oz glass of wine (148 mL), or one 1 oz glass of hard liquor (44 mL). Lifestyle Do not use any products that contain nicotine or tobacco. These products include cigarettes, chewing tobacco, and vaping devices, such as e-cigarettes. If you need help quitting, ask your health care provider. Do not use street drugs. Do not share needles. Ask your health care provider for help if you need support or information about quitting drugs. General instructions Schedule regular health, dental, and eye exams. Stay current with your vaccines. Tell your health care provider if: You often feel depressed. You have ever been abused or do not feel safe at home. Summary Adopting a healthy lifestyle and getting preventive care are important in promoting health and wellness. Follow your health care provider's instructions about healthy diet, exercising, and getting tested or screened for diseases. Follow your health care provider's instructions on monitoring your  cholesterol and blood pressure. This information is not intended to replace advice given to you by your health care provider. Make sure you discuss any questions you have with your health care provider. Document Revised: 02/03/2021 Document Reviewed: 02/03/2021 Elsevier Patient Education  Rafael Capo, Adult The ears produce a substance called earwax that helps keep bacteria out of the ear and protects the skin in the ear canal. Occasionally, earwax can build up in the ear and cause discomfort or hearing loss. What are the causes? This condition is caused by a buildup of earwax. Ear canals are self-cleaning. Ear wax is made in the outer part of the ear canal and generally falls out in small amounts over time. When the self-cleaning mechanism is not working, earwax builds up and can cause decreased hearing and discomfort. Attempting to clean ears with cotton swabs can push the earwax deep into the ear canal and cause decreased hearing and pain. What increases the risk? This condition is more likely to develop in people who: Clean their ears often with cotton swabs. Pick at their ears. Use earplugs or in-ear headphones often, or wear hearing aids. The following factors may also make you more likely to develop this condition: Being female.  Being of older age. Naturally producing more earwax. Having narrow ear canals. Having earwax that is overly thick or sticky. Having excess hair in the ear canal. Having eczema. Being dehydrated. What are the signs or symptoms? Symptoms of this condition include: Reduced or muffled hearing. A feeling of fullness in the ear or feeling that the ear is plugged. Fluid coming from the ear. Ear pain or an itchy ear. Ringing in the ear. Coughing. Balance problems. An obvious piece of earwax that can be seen inside the ear canal. How is this diagnosed? This condition may be diagnosed based on: Your symptoms. Your medical history. An  ear exam. During the exam, your health care provider will look into your ear with an instrument called an otoscope. You may have tests, including a hearing test. How is this treated? This condition may be treated by: Using ear drops to soften the earwax. Having the earwax removed by a health care provider. The health care provider may: Flush the ear with water. Use an instrument that has a loop on the end (curette). Use a suction device. Having surgery to remove the wax buildup. This may be done in severe cases. Follow these instructions at home:  Take over-the-counter and prescription medicines only as told by your health care provider. Do not put any objects, including cotton swabs, into your ear. You can clean the opening of your ear canal with a washcloth or facial tissue. Follow instructions from your health care provider about cleaning your ears. Do not overclean your ears. Drink enough fluid to keep your urine pale yellow. This will help to thin the earwax. Keep all follow-up visits as told. If earwax builds up in your ears often or if you use hearing aids, consider seeing your health care provider for routine, preventive ear cleanings. Ask your health care provider how often you should schedule your cleanings. If you have hearing aids, clean them according to instructions from the manufacturer and your health care provider. Contact a health care provider if: You have ear pain. You develop a fever. You have pus or other fluid coming from your ear. You have hearing loss. You have ringing in your ears that does not go away. You feel like the room is spinning (vertigo). Your symptoms do not improve with treatment. Get help right away if: You have bleeding from the affected ear. You have severe ear pain. Summary Earwax can build up in the ear and cause discomfort or hearing loss. The most common symptoms of this condition include reduced or muffled hearing, a feeling of fullness in  the ear, or feeling that the ear is plugged. This condition may be diagnosed based on your symptoms, your medical history, and an ear exam. This condition may be treated by using ear drops to soften the earwax or by having the earwax removed by a health care provider. Do not put any objects, including cotton swabs, into your ear. You can clean the opening of your ear canal with a washcloth or facial tissue. This information is not intended to replace advice given to you by your health care provider. Make sure you discuss any questions you have with your health care provider. Document Revised: 01/02/2020 Document Reviewed: 01/02/2020 Elsevier Patient Education  2023 Elsevier Inc.   Clean ears with 1/2 water and 1/2 peroxide and you can use debrox drops. Instructions for home care to prevent wax buildup are given. Avoid using q-tips or bobby pins to ears  If have problems with hearing  or pain return to office,

## 2022-08-29 LAB — MICROALBUMIN / CREATININE URINE RATIO
Creatinine, Urine: 63.9 mg/dL
Microalb/Creat Ratio: 5 mg/g creat (ref 0–29)
Microalbumin, Urine: 3.5 ug/mL

## 2022-08-29 LAB — CMP14+EGFR
ALT: 5 IU/L (ref 0–32)
AST: 14 IU/L (ref 0–40)
Albumin/Globulin Ratio: 1.7 (ref 1.2–2.2)
Albumin: 4.5 g/dL (ref 3.8–4.9)
Alkaline Phosphatase: 50 IU/L (ref 44–121)
BUN/Creatinine Ratio: 16 (ref 9–23)
BUN: 13 mg/dL (ref 6–24)
Bilirubin Total: 0.6 mg/dL (ref 0.0–1.2)
CO2: 25 mmol/L (ref 20–29)
Calcium: 9.9 mg/dL (ref 8.7–10.2)
Chloride: 102 mmol/L (ref 96–106)
Creatinine, Ser: 0.83 mg/dL (ref 0.57–1.00)
Globulin, Total: 2.7 g/dL (ref 1.5–4.5)
Glucose: 72 mg/dL (ref 70–99)
Potassium: 4.2 mmol/L (ref 3.5–5.2)
Sodium: 139 mmol/L (ref 134–144)
Total Protein: 7.2 g/dL (ref 6.0–8.5)
eGFR: 84 mL/min/{1.73_m2} (ref >59)

## 2022-08-29 LAB — CBC
Hematocrit: 36.2 % (ref 34.0–46.6)
Hemoglobin: 12.2 g/dL (ref 11.1–15.9)
MCH: 28.4 pg (ref 26.6–33.0)
MCHC: 33.7 g/dL (ref 31.5–35.7)
MCV: 84 fL (ref 79–97)
Platelets: 185 10*3/uL (ref 150–450)
RBC: 4.29 x10E6/uL (ref 3.77–5.28)
RDW: 12.6 % (ref 11.7–15.4)
WBC: 5.6 10*3/uL (ref 3.4–10.8)

## 2022-08-29 LAB — LIPID PANEL
Chol/HDL Ratio: 2.9 ratio (ref 0.0–4.4)
Cholesterol, Total: 199 mg/dL (ref 100–199)
HDL: 69 mg/dL (ref >39)
LDL Chol Calc (NIH): 122 mg/dL — ABNORMAL HIGH (ref 0–99)
Triglycerides: 42 mg/dL (ref 0–149)
VLDL Cholesterol Cal: 8 mg/dL (ref 5–40)

## 2022-08-29 LAB — VITAMIN D 25 HYDROXY (VIT D DEFICIENCY, FRACTURES): Vit D, 25-Hydroxy: 90.8 ng/mL (ref 30.0–100.0)

## 2022-10-11 ENCOUNTER — Other Ambulatory Visit: Payer: Self-pay | Admitting: Nurse Practitioner

## 2022-10-11 DIAGNOSIS — I10 Essential (primary) hypertension: Secondary | ICD-10-CM

## 2022-12-28 ENCOUNTER — Ambulatory Visit (INDEPENDENT_AMBULATORY_CARE_PROVIDER_SITE_OTHER): Payer: Commercial Managed Care - HMO | Admitting: Nurse Practitioner

## 2022-12-28 VITALS — BP 122/72 | HR 85 | Temp 97.9°F | Ht 63.0 in | Wt 134.0 lb

## 2022-12-28 DIAGNOSIS — I10 Essential (primary) hypertension: Secondary | ICD-10-CM

## 2022-12-28 DIAGNOSIS — E7800 Pure hypercholesterolemia, unspecified: Secondary | ICD-10-CM

## 2022-12-28 NOTE — Progress Notes (Signed)
I,Sheena H Holbrook,acting as a Education administrator for Minette Brine, FNP.,have documented all relevant documentation on the behalf of Minette Brine, FNP,as directed by  Minette Brine, FNP while in the presence of Minette Brine, [place].    Subjective:     Patient ID: Donna Barrera , female    DOB: [birth year] , 52 y.o.   MRN: [id number]   Chief Complaint  Patient presents with   Medical Management of Chronic Issues    HPI  Patient presents today for hypertension follow up. Patient does take her BP at home and states it has been doing well. Patient has no other complaints or concerns.   Wt Readings from Last 3 Encounters: 12/28/22 : 134 lb (60.8 kg) 08/27/22 : 133 lb (60.3 kg) 04/13/22 : 135 lb 6.4 oz (61.4 kg)        Past Medical History:  Diagnosis Date   Hypertension      Family History  Problem Relation Age of Onset   Breast cancer Mother    Cancer Mother    Hypertension Maternal Grandmother    Cancer Maternal Grandmother      Current Outpatient Medications:    telmisartan (MICARDIS) 20 MG tablet, TAKE 1 TABLET BY MOUTH EVERY DAY, Disp: 30 tablet, Rfl: 2   No Known Allergies   Review of Systems  Constitutional: Negative.  Negative for activity change and fatigue.  Eyes:  Negative for visual disturbance.  Respiratory: Negative.  Negative for choking, shortness of breath and wheezing.   Cardiovascular: Negative.  Negative for chest pain, palpitations and leg swelling.  Gastrointestinal: Negative.   Endocrine: Negative.  Negative for polydipsia, polyphagia and polyuria.  Musculoskeletal: Negative.   Skin: Negative.   Neurological:  Negative for dizziness, weakness and headaches.  Psychiatric/Behavioral: Negative.  Negative for confusion. The patient is not nervous/anxious.      Today's Vitals   12/28/22 1449  BP: 122/72  Pulse: 85  Temp: 97.9 F (36.6 C)  TempSrc: Oral  SpO2: 99%  Weight: 134 lb (60.8 kg)  Height: 5\' 3"  (1.6 m)   Body mass index is 23.74  kg/m.   Objective:  Physical Exam Vitals reviewed.  Constitutional:      General: She is not in acute distress.    Appearance: Normal appearance. She is well-developed.  HENT:     Head: Normocephalic and atraumatic.  Eyes:     Pupils: Pupils are equal, round, and reactive to light.  Cardiovascular:     Rate and Rhythm: Normal rate and regular rhythm.     Pulses: Normal pulses.     Heart sounds: Normal heart sounds. No murmur heard. Pulmonary:     Effort: Pulmonary effort is normal. No respiratory distress.     Breath sounds: Normal breath sounds. No wheezing.  Skin:    General: Skin is warm and dry.     Capillary Refill: Capillary refill takes less than 2 seconds.  Neurological:     General: No focal deficit present.     Mental Status: She is alert and oriented to person, place, and time.     Cranial Nerves: No cranial nerve deficit.     Motor: No weakness.  Psychiatric:        Mood and Affect: Mood normal.        Behavior: Behavior normal.        Thought Content: Thought content normal.        Judgment: Judgment normal.         Assessment And Plan:  1. Essential hypertension Comments: Blood pressure is well controlled, continue current medications - BMP8+eGFR  2. Elevated LDL cholesterol level Comments: LDL is slightly elevated but slightly better continue to limit intake of fried and fatty foods. - Lipid panel   Patient was given opportunity to ask questions. Patient verbalized understanding of the plan and was able to repeat key elements of the plan. All questions were answered to their satisfaction.  Minette Brine, FNP   I, Minette Brine, FNP, have reviewed all documentation for this visit. The documentation on 12/28/22 for the exam, diagnosis, procedures, and orders are all accurate and complete.   IF YOU HAVE BEEN REFERRED TO A SPECIALIST, IT MAY TAKE 1-2 WEEKS TO SCHEDULE/PROCESS THE REFERRAL. IF YOU HAVE NOT HEARD FROM US/SPECIALIST IN TWO WEEKS, PLEASE  GIVE [place] A CALL AT [phone] X [phone].   THE PATIENT IS ENCOURAGED TO PRACTICE SOCIAL DISTANCING DUE TO THE COVID-19 PANDEMIC.

## 2022-12-29 LAB — BMP8+EGFR
BUN/Creatinine Ratio: 16 (ref 9–23)
BUN: 13 mg/dL (ref 6–24)
CO2: 23 mmol/L (ref 20–29)
Calcium: 10.1 mg/dL (ref 8.7–10.2)
Chloride: 104 mmol/L (ref 96–106)
Creatinine, Ser: 0.81 mg/dL (ref 0.57–1.00)
Glucose: 72 mg/dL (ref 70–99)
Potassium: 4.2 mmol/L (ref 3.5–5.2)
Sodium: 140 mmol/L (ref 134–144)
eGFR: 87 mL/min/{1.73_m2} (ref >59)

## 2022-12-29 LAB — LIPID PANEL
Chol/HDL Ratio: 2.9 ratio (ref 0.0–4.4)
Cholesterol, Total: 194 mg/dL (ref 100–199)
HDL: 66 mg/dL (ref >39)
LDL Chol Calc (NIH): 117 mg/dL — ABNORMAL HIGH (ref 0–99)
Triglycerides: 57 mg/dL (ref 0–149)
VLDL Cholesterol Cal: 11 mg/dL (ref 5–40)

## 2023-03-01 ENCOUNTER — Encounter: Payer: Commercial Managed Care - HMO | Admitting: Nurse Practitioner

## 2023-03-03 ENCOUNTER — Other Ambulatory Visit: Payer: Self-pay | Admitting: Obstetrics & Gynecology

## 2023-03-03 DIAGNOSIS — D249 Benign neoplasm of unspecified breast: Secondary | ICD-10-CM

## 2023-04-13 ENCOUNTER — Ambulatory Visit
Admission: RE | Admit: 2023-04-13 | Discharge: 2023-04-13 | Disposition: A | Discharge status: Home / Self Care | Payer: Commercial Managed Care - HMO | Source: Ambulatory Visit | Attending: Obstetrics & Gynecology | Admitting: Obstetrics & Gynecology

## 2023-04-13 DIAGNOSIS — D249 Benign neoplasm of unspecified breast: Secondary | ICD-10-CM

## 2023-04-13 MED ORDER — GADOPICLENOL 0.5 MMOL/ML IV SOLN
6.0000 mL | Freq: Once | INTRAVENOUS | Status: AC | PRN
Start: 2023-04-13 — End: 2023-04-13
  Administered 2023-04-13: 6 mL via INTRAVENOUS

## 2023-04-14 ENCOUNTER — Other Ambulatory Visit: Payer: Self-pay | Admitting: Obstetrics & Gynecology

## 2023-04-14 DIAGNOSIS — R9389 Abnormal findings on diagnostic imaging of other specified body structures: Secondary | ICD-10-CM

## 2023-05-07 ENCOUNTER — Other Ambulatory Visit: Payer: Commercial Managed Care - HMO

## 2023-05-18 ENCOUNTER — Other Ambulatory Visit: Payer: Self-pay | Admitting: Nurse Practitioner

## 2023-05-18 DIAGNOSIS — I10 Essential (primary) hypertension: Secondary | ICD-10-CM

## 2023-08-28 ENCOUNTER — Other Ambulatory Visit: Payer: Self-pay | Admitting: Nurse Practitioner

## 2023-08-28 DIAGNOSIS — I10 Essential (primary) hypertension: Secondary | ICD-10-CM

## 2023-08-31 ENCOUNTER — Encounter: Payer: Commercial Managed Care - HMO | Admitting: Nurse Practitioner

## 2023-08-31 NOTE — Progress Notes (Unsigned)
I,Jameka J Llittleton, CMA,acting as a Neurosurgeon for [employer], FNP.,have documented all relevant documentation on the behalf of Arnette Felts, FNP,as directed by  Arnette Felts, FNP while in the presence of Arnette Felts, FNP.  Subjective:    Patient ID: Donna Barrera , female    DOB: 09-06-1971 , 52 y.o.   MRN: [id number]  No chief complaint on file.   HPI  Patient is here for a physical exam. Patient is followed by Dr...Marland KitchenMarland KitchenMarland Kitchen for her GYN care. Patient reports compliance with her meds. Patient denies having any headaches, chest pain or sob. Patient does not have any questions or concerns at this time.     Past Medical History:  Diagnosis Date  . Hypertension      Family History  Problem Relation Age of Onset  . Breast cancer Mother   . Cancer Mother   . Hypertension Maternal Grandmother   . Cancer Maternal Grandmother      Current Outpatient Medications:  .  telmisartan (MICARDIS) 20 MG tablet, TAKE 1 TABLET BY MOUTH EVERY DAY, Disp: 30 tablet, Rfl: 2   No Known Allergies    The patient states she uses {contraceptive methods:5051} for birth control. No LMP recorded. (Menstrual status: Irregular Periods).. {Dysmenorrhea-menorrhagia:21918}. Negative for: breast discharge, breast lump(s), breast pain and breast self exam. Associated symptoms include abnormal vaginal bleeding. Pertinent negatives include abnormal bleeding (hematology), anxiety, decreased libido, depression, difficulty falling sleep, dyspareunia, history of infertility, nocturia, sexual dysfunction, sleep disturbances, urinary incontinence, urinary urgency, vaginal discharge and vaginal itching. Diet regular.The patient states her exercise level is    . The patient's tobacco use is:  Social History   Tobacco Use  Smoking Status Never  Smokeless Tobacco Not on file  . She has been exposed to passive smoke. The patient's alcohol use is:  Social History   Substance and Sexual Activity  Alcohol Use No  .  Additional information: Last pap ***, next one scheduled for ***.    Review of Systems  Constitutional: Negative.   HENT: Negative.    Eyes: Negative.   Respiratory: Negative.    Cardiovascular: Negative.   Gastrointestinal: Negative.   Endocrine: Negative.   Genitourinary: Negative.   Musculoskeletal: Negative.   Skin: Negative.   Allergic/Immunologic: Negative.   Neurological: Negative.   Hematological: Negative.   Psychiatric/Behavioral: Negative.      There were no vitals filed for this visit. There is no height or weight on file to calculate BMI.  Wt Readings from Last 3 Encounters:  12/28/22 134 lb (60.8 kg)  08/27/22 133 lb (60.3 kg)  04/13/22 135 lb 6.4 oz (61.4 kg)     Objective:  Physical Exam      Assessment And Plan:     Encounter for general adult medical examination w/o abnormal findings  Essential hypertension  Elevated LDL cholesterol level  Other long term (current) drug therapy     No follow-ups on file. Patient was given opportunity to ask questions. Patient verbalized understanding of the plan and was able to repeat key elements of the plan. All questions were answered to their satisfaction.   Arnette Felts, FNP  I, Arnette Felts, FNP, have reviewed all documentation for this visit. The documentation on 08/31/23 for the exam, diagnosis, procedures, and orders are all accurate and complete.

## 2023-10-01 ENCOUNTER — Encounter: Payer: Commercial Managed Care - HMO | Admitting: Family Medicine

## 2023-12-07 ENCOUNTER — Ambulatory Visit (INDEPENDENT_AMBULATORY_CARE_PROVIDER_SITE_OTHER): Payer: Commercial Managed Care - HMO | Admitting: Nurse Practitioner

## 2023-12-07 ENCOUNTER — Encounter: Payer: Self-pay | Admitting: Nurse Practitioner

## 2023-12-07 VITALS — BP 130/70 | HR 61 | Temp 98.0°F | Ht 63.0 in | Wt 137.0 lb

## 2023-12-07 DIAGNOSIS — E559 Vitamin D deficiency, unspecified: Secondary | ICD-10-CM

## 2023-12-07 DIAGNOSIS — I10 Essential (primary) hypertension: Secondary | ICD-10-CM

## 2023-12-07 DIAGNOSIS — Z79899 Other long term (current) drug therapy: Secondary | ICD-10-CM

## 2023-12-07 DIAGNOSIS — E7800 Pure hypercholesterolemia, unspecified: Secondary | ICD-10-CM

## 2023-12-07 DIAGNOSIS — Z0000 Encounter for general adult medical examination without abnormal findings: Secondary | ICD-10-CM

## 2023-12-07 DIAGNOSIS — Z2821 Immunization not carried out because of patient refusal: Secondary | ICD-10-CM

## 2023-12-07 LAB — POCT URINALYSIS DIP (CLINITEK)
Bilirubin, UA: NEGATIVE
Blood, UA: NEGATIVE
Glucose, UA: NEGATIVE mg/dL
Ketones, POC UA: NEGATIVE mg/dL
Leukocytes, UA: NEGATIVE
Nitrite, UA: NEGATIVE
POC PROTEIN,UA: NEGATIVE
Spec Grav, UA: 1.02 (ref 1.010–1.025)
Urobilinogen, UA: 0.2 U/dL
pH, UA: 6 (ref 5.0–8.0)

## 2023-12-07 MED ORDER — TELMISARTAN 20 MG PO TABS: 20.0000 mg | ORAL_TABLET | Freq: Every day | ORAL | 1 refills | Status: DC

## 2023-12-07 NOTE — Progress Notes (Signed)
 Madelaine Bhat, CMA,acting as a Neurosurgeon for Arnette Felts, FNP.,have documented all relevant documentation on the behalf of Arnette Felts, FNP,as directed by  Arnette Felts, FNP while in the presence of Arnette Felts, FNP.  Subjective:    Patient ID: Donna Barrera , female    DOB: [birth year] , 53 y.o.   MRN: [id number]  Chief Complaint  Patient presents with   Annual Exam    HPI  Patient presents today for HM, Patient reports compliance with medication. Patient denies any chest pain, SOB, or headaches. Patient has no concerns today. She is going to Dr Mariea Stable at [facility]. She has to do a repeat mammogram.      Past Medical History:  Diagnosis Date   Hypertension      Family History  Problem Relation Age of Onset   Breast cancer Mother    Cancer Mother    Hypertension Maternal Grandmother    Cancer Maternal Grandmother      Current Outpatient Medications:    telmisartan (MICARDIS) 20 MG tablet, Take 1 tablet (20 mg total) by mouth daily., Disp: 90 tablet, Rfl: 1   No Known Allergies    The patient states she uses tubal ligation for birth control. Patient's last menstrual period was 11/16/2023.. Negative for Dysmenorrhea and Negative for Menorrhagia. Negative for: breast discharge, breast lump(s), breast pain and breast self exam. Associated symptoms include abnormal vaginal bleeding. Pertinent negatives include abnormal bleeding (hematology), anxiety, decreased libido, depression, difficulty falling sleep, dyspareunia, history of infertility, nocturia, sexual dysfunction, sleep disturbances, urinary incontinence, urinary urgency, vaginal discharge and vaginal itching. Diet regular; she is eating more vegetables.The patient states her exercise level is moderate 4-7 days a week (walking, running and lifting weights), additional cardio  The patient's tobacco use is:  Social History   Tobacco Use  Smoking Status Never  Smokeless Tobacco Not on file  . She has  been exposed to passive smoke. The patient's alcohol use is:  Social History   Substance and Sexual Activity  Alcohol Use No   Additional information: Last pap 02/18/2021, next one scheduled for 02/19/2024.    Review of Systems  Constitutional: Negative.   HENT: Negative.    Eyes: Negative.   Respiratory: Negative.    Cardiovascular: Negative.   Gastrointestinal: Negative.   Endocrine: Negative.   Genitourinary: Negative.   Musculoskeletal: Negative.   Skin: Negative.   Allergic/Immunologic: Negative.   Neurological: Negative.   Hematological: Negative.   Psychiatric/Behavioral: Negative.       Today's Vitals   12/07/23 1111 12/07/23 1124  BP: 110/84 130/70  Pulse: 61   Temp: 98 F (36.7 C)   TempSrc: Oral   Weight: 137 lb (62.1 kg)   Height: 5\' 3"  (1.6 m)   PainSc: 0-No pain    Body mass index is 24.27 kg/m.  Wt Readings from Last 3 Encounters:  12/07/23 137 lb (62.1 kg)  12/28/22 134 lb (60.8 kg)  08/27/22 133 lb (60.3 kg)     Objective:  Physical Exam Vitals reviewed.  Constitutional:      General: She is not in acute distress.    Appearance: Normal appearance. She is well-developed.  HENT:     Head: Normocephalic and atraumatic.     Right Ear: Hearing, tympanic membrane, ear canal and external ear normal. There is no impacted cerumen.     Left Ear: Hearing, tympanic membrane, ear canal and external ear normal. There is no impacted cerumen.     Nose: Nose normal.  Mouth/Throat:     Mouth: Mucous membranes are moist.  Eyes:     General: Lids are normal.     Extraocular Movements: Extraocular movements intact.     Conjunctiva/sclera: Conjunctivae normal.     Pupils: Pupils are equal, round, and reactive to light.     Funduscopic exam:    Right eye: No papilledema.        Left eye: No papilledema.  Neck:     Thyroid: No thyroid mass.     Vascular: No carotid bruit.  Cardiovascular:     Rate and Rhythm: Normal rate and regular rhythm.     Pulses:  Normal pulses.     Heart sounds: Normal heart sounds. No murmur heard. Pulmonary:     Effort: Pulmonary effort is normal. No respiratory distress.     Breath sounds: Normal breath sounds. No wheezing.  Chest:     Chest wall: No mass.  Breasts:    Tanner Score is 5.     Right: Normal. No mass or tenderness.     Left: Normal. No mass or tenderness.  Abdominal:     General: Abdomen is flat. Bowel sounds are normal. There is no distension.     Palpations: Abdomen is soft.     Tenderness: There is no abdominal tenderness.  Genitourinary:    Rectum: Guaiac result negative.  Musculoskeletal:        General: No swelling. Normal range of motion.     Cervical back: Full passive range of motion without pain, normal range of motion and neck supple.     Right lower leg: No edema.     Left lower leg: No edema.  Lymphadenopathy:     Upper Body:     Right upper body: No supraclavicular, axillary or pectoral adenopathy.     Left upper body: No supraclavicular, axillary or pectoral adenopathy.  Skin:    General: Skin is warm and dry.     Capillary Refill: Capillary refill takes less than 2 seconds.  Neurological:     General: No focal deficit present.     Mental Status: She is alert and oriented to person, place, and time.     Cranial Nerves: No cranial nerve deficit.     Sensory: No sensory deficit.     Motor: No weakness.  Psychiatric:        Mood and Affect: Mood normal.        Behavior: Behavior normal.        Thought Content: Thought content normal.        Judgment: Judgment normal.         Assessment And Plan:     Encounter for annual health examination Assessment & Plan: Behavior modifications discussed and diet history reviewed.   Pt will continue to exercise regularly and modify diet with low GI, plant based foods and decrease intake of processed foods.  Recommend intake of daily multivitamin, Vitamin D, and calcium.  Recommend mammogram and colonoscopy for preventive  screenings, as well as recommend immunizations that include influenza, TDAP, and Shingles    Essential hypertension Assessment & Plan: Blood pressure is controlled, continue current medications  Orders: -     EKG 12-Lead -     POCT URINALYSIS DIP (CLINITEK) -     Microalbumin / creatinine urine ratio -     CMP14+EGFR -     Telmisartan; Take 1 tablet (20 mg total) by mouth daily.  Dispense: 90 tablet; Refill: 1  Elevated LDL cholesterol  level Assessment & Plan: Cholesterol levels were slightly elevated, cut back on fried and fatty foods.   Orders: -     Lipid panel  Vitamin D deficiency Assessment & Plan: Will check vitamin D level and supplement as needed.    Also encouraged to spend 15 minutes in the sun daily.    Orders: -     VITAMIN D 25 Hydroxy (Vit-D Deficiency, Fractures)  Influenza vaccination declined Assessment & Plan: Patient declined influenza vaccination at this time. Patient is aware that influenza vaccine prevents illness in 70% of healthy people, and reduces hospitalizations to 30-70% in elderly. This vaccine is recommended annually. Education has been provided regarding the importance of this vaccine but patient still declined. Advised may receive this vaccine at local pharmacy or Health Dept.or vaccine clinic. Aware to provide a copy of the vaccination record if obtained from local pharmacy or Health Dept.  Pt is willing to accept risk associated with refusing vaccination.    Other long term (current) drug therapy -     CBC with Differential/Platelet     Return for 1 year physical, 6 month bp check. Patient was given opportunity to ask questions. Patient verbalized understanding of the plan and was able to repeat key elements of the plan. All questions were answered to their satisfaction.   Arnette Felts, FNP   I, Arnette Felts, FNP, have reviewed all documentation for this visit. The documentation on 12/07/23 for the exam, diagnosis, procedures, and  orders are all accurate and complete.

## 2023-12-09 LAB — CMP14+EGFR
ALT: 24 IU/L (ref 0–32)
AST: 58 IU/L — ABNORMAL HIGH (ref 0–40)
Albumin: 4 g/dL (ref 3.8–4.9)
Alkaline Phosphatase: 52 IU/L (ref 44–121)
BUN/Creatinine Ratio: 14 (ref 9–23)
BUN: 11 mg/dL (ref 6–24)
Bilirubin Total: 0.4 mg/dL (ref 0.0–1.2)
CO2: 23 mmol/L (ref 20–29)
Calcium: 9.4 mg/dL (ref 8.7–10.2)
Chloride: 101 mmol/L (ref 96–106)
Creatinine, Ser: 0.81 mg/dL (ref 0.57–1.00)
Globulin, Total: 2.8 g/dL (ref 1.5–4.5)
Glucose: 62 mg/dL — ABNORMAL LOW (ref 70–99)
Potassium: 3.9 mmol/L (ref 3.5–5.2)
Sodium: 139 mmol/L (ref 134–144)
Total Protein: 6.8 g/dL (ref 6.0–8.5)
eGFR: 86 mL/min/{1.73_m2} (ref >59)

## 2023-12-09 LAB — CBC WITH DIFFERENTIAL/PLATELET
Basophils Absolute: 0 10*3/uL (ref 0.0–0.2)
Basos: 1 %
EOS (ABSOLUTE): 0 10*3/uL (ref 0.0–0.4)
Eos: 1 %
Hematocrit: 34.8 % (ref 34.0–46.6)
Hemoglobin: 11.3 g/dL (ref 11.1–15.9)
Immature Grans (Abs): 0 10*3/uL (ref 0.0–0.1)
Immature Granulocytes: 0 %
Lymphocytes Absolute: 1.6 10*3/uL (ref 0.7–3.1)
Lymphs: 43 %
MCH: 28.9 pg (ref 26.6–33.0)
MCHC: 32.5 g/dL (ref 31.5–35.7)
MCV: 89 fL (ref 79–97)
Monocytes Absolute: 0.4 10*3/uL (ref 0.1–0.9)
Monocytes: 11 %
Neutrophils Absolute: 1.6 10*3/uL (ref 1.4–7.0)
Neutrophils: 44 %
Platelets: 179 10*3/uL (ref 150–450)
RBC: 3.91 x10E6/uL (ref 3.77–5.28)
RDW: 12.4 % (ref 11.7–15.4)
WBC: 3.7 10*3/uL (ref 3.4–10.8)

## 2023-12-09 LAB — LIPID PANEL
Chol/HDL Ratio: 2.7 ratio (ref 0.0–4.4)
Cholesterol, Total: 162 mg/dL (ref 100–199)
HDL: 59 mg/dL (ref >39)
LDL Chol Calc (NIH): 92 mg/dL (ref 0–99)
Triglycerides: 54 mg/dL (ref 0–149)
VLDL Cholesterol Cal: 11 mg/dL (ref 5–40)

## 2023-12-09 LAB — MICROALBUMIN / CREATININE URINE RATIO
Creatinine, Urine: 142 mg/dL
Microalb/Creat Ratio: 4 mg/g{creat} (ref 0–29)
Microalbumin, Urine: 5.3 ug/mL

## 2023-12-09 LAB — VITAMIN D 25 HYDROXY (VIT D DEFICIENCY, FRACTURES): Vit D, 25-Hydroxy: 66.7 ng/mL (ref 30.0–100.0)

## 2023-12-19 ENCOUNTER — Encounter: Payer: Self-pay | Admitting: Nurse Practitioner

## 2023-12-19 DIAGNOSIS — Z2821 Immunization not carried out because of patient refusal: Secondary | ICD-10-CM | POA: Insufficient documentation

## 2023-12-19 DIAGNOSIS — E559 Vitamin D deficiency, unspecified: Secondary | ICD-10-CM | POA: Insufficient documentation

## 2023-12-19 DIAGNOSIS — Z0000 Encounter for general adult medical examination without abnormal findings: Secondary | ICD-10-CM | POA: Insufficient documentation

## 2023-12-19 NOTE — Assessment & Plan Note (Signed)

## 2023-12-19 NOTE — Assessment & Plan Note (Signed)
 Will check vitamin D level and supplement as needed.    Also encouraged to spend 15 minutes in the sun daily.

## 2023-12-19 NOTE — Assessment & Plan Note (Signed)
 Blood pressure is controlled, continue current medications

## 2023-12-19 NOTE — Assessment & Plan Note (Signed)
 Cholesterol levels were slightly elevated, cut back on fried and fatty foods.

## 2023-12-19 NOTE — Assessment & Plan Note (Signed)

## 2024-03-29 ENCOUNTER — Ambulatory Visit (INDEPENDENT_AMBULATORY_CARE_PROVIDER_SITE_OTHER): Admitting: Obstetrics and Gynecology

## 2024-03-29 ENCOUNTER — Encounter: Payer: Self-pay | Admitting: Obstetrics and Gynecology

## 2024-03-29 ENCOUNTER — Other Ambulatory Visit (HOSPITAL_COMMUNITY)
Admission: RE | Admit: 2024-03-29 | Discharge: 2024-03-29 | Disposition: A | Discharge status: Home / Self Care | Source: Ambulatory Visit | Attending: Obstetrics and Gynecology | Admitting: Obstetrics and Gynecology

## 2024-03-29 VITALS — BP 124/84 | HR 72 | Ht 62.0 in | Wt 138.2 lb

## 2024-03-29 DIAGNOSIS — Z124 Encounter for screening for malignant neoplasm of cervix: Secondary | ICD-10-CM

## 2024-03-29 DIAGNOSIS — Z01419 Encounter for gynecological examination (general) (routine) without abnormal findings: Secondary | ICD-10-CM

## 2024-03-29 DIAGNOSIS — Z113 Encounter for screening for infections with a predominantly sexual mode of transmission: Secondary | ICD-10-CM

## 2024-03-29 DIAGNOSIS — Z872 Personal history of diseases of the skin and subcutaneous tissue: Secondary | ICD-10-CM | POA: Diagnosis not present

## 2024-03-29 DIAGNOSIS — Z1231 Encounter for screening mammogram for malignant neoplasm of breast: Secondary | ICD-10-CM

## 2024-03-29 DIAGNOSIS — N951 Menopausal and female climacteric states: Secondary | ICD-10-CM

## 2024-03-29 NOTE — Progress Notes (Signed)
 ANNUAL EXAM Patient name: Donna Barrera MRN [id number]  Date of birth: 08-20-1971 Chief Complaint:   Annual Exam  History of Present Illness:   Donna Barrera is a 53 y.o. [id number]  female being seen today for a routine annual exam.  Current complaints: Presents for new annual exam.  She has a history of bilateral breast cysts.  She had both breast biopsied in 2024 for, those biopsies were both normal.  She reports not having that.  Since March of this year, has had some spotting, she has occasional hot flashes.  She reports that she is managing with dietary and exercise.  She has a primary care provider that she sees regularly.  She has had a BTL  Patient's last menstrual period was 12/21/2023 (exact date).     The pregnancy intention screening data noted above was reviewed. Potential methods of contraception were discussed. The patient elected to proceed with No data recorded.   Gynecologic History Patient's last menstrual period was . Contraception:  Last Pap: reports having one last year that was normal. Results were: Normal Last mammogram: 2024.      03/29/2024    2:16 PM 12/07/2023   11:11 AM 12/28/2022    2:48 PM 08/27/2022    2:14 PM 04/13/2022    9:05 AM  Depression screen PHQ 2/9  Decreased Interest 0 0 0 0 0  Down, Depressed, Hopeless 0 0 0 0 0  PHQ - 2 Score 0 0 0 0 0  Altered sleeping 0 0   0  Tired, decreased energy 0 0   0  Change in appetite 0 0   0  Feeling bad or failure about yourself  0 0   0  Trouble concentrating 0 0   0  Moving slowly or fidgety/restless 0 0   0  Suicidal thoughts 0 0   0  PHQ-9 Score 0 0   0  Difficult doing work/chores  Not difficult at all           03/29/2024    2:17 PM 12/07/2023   11:11 AM  GAD 7 : Generalized Anxiety Score  Nervous, Anxious, on Edge 0 0  Control/stop worrying 0 0  Worry too much - different things 0 0  Trouble relaxing  0  Restless 0 0  Easily annoyed or irritable 0 0  Afraid - awful might  happen 0 0  Total GAD 7 Score  0  Anxiety Difficulty  Not difficult at all     Review of Systems:   Pertinent items are noted in HPI Denies any headaches, blurred vision, fatigue, shortness of breath, chest pain, abdominal pain, abnormal vaginal discharge/itching/odor/irritation, problems with periods, bowel movements, urination, or intercourse unless otherwise stated above. Pertinent History Reviewed:  Reviewed past medical,surgical, social and family history.  Reviewed problem list, medications and allergies. Physical Assessment:   Vitals:   03/29/24 1403  BP: 124/84  Pulse: 72  Weight: 138 lb 3.2 oz (62.7 kg)  Height: 5' 2 (1.575 m)  Body mass index is 25.28 kg/m.        Physical Examination:   General appearance - well appearing, and in no distress  Mental status - alert, oriented   Psych:  She has a normal mood and affect  Skin - warm and dry, normal color  Chest - effort normal, all lung fields clear to auscultation bilaterally  Heart - normal rate and regular rhythm  Neck:  midline trachea  Breasts - breasts appear normal, no  suspicious masses, no skin or nipple changes or  axillary nodes  Abdomen - soft, nontender, nondistended  Pelvic - VULVA: normal appearing vulva with no masses, tenderness or lesions  VAGINA: normal appearing vagina with normal color and discharge, no lesions  CERVIX: normal appearing cervix without discharge or lesions, no CMT  Thin prep pap is done w HR HPV cotesting  UTERUS: uterus is felt to be normal size, shape, consistency and nontender   ADNEXA: No adnexal masses or tenderness noted.  Extremities:  No swelling or varicosities noted  Chaperone present for exam  No results found for this or any previous visit (from the past 24 hours).  Assessment & Plan:  1. Encounter for annual routine gynecological examination (Primary) Established with PCP, annual blood work with them Desired pap today  UTD on colonoscopy    2. History of cyst of  breast Referral for mammogram  - MM 3D SCREENING MAMMOGRAM BILATERAL BREAST; Future  3. Screen for STD (sexually transmitted disease)  - RPR+HBsAg+HCVAb+...  4. Cervical cancer screening  - Cytology - PAP( [facility])  5. Encounter for screening mammogram for malignant neoplasm of breast  - MM 3D SCREENING MAMMOGRAM BILATERAL BREAST; Future  6. Perimenopausal Discussed perimenopause, irregularity of periods, discussed menopause with cessation after 1 year. She reports managing with diet and exercise, follow up if symptoms worsen or desires treatment    Labs/procedures today:   Mammogram: schedule screening mammo as soon as possible, or sooner if problems Colonoscopy: per GI, or sooner if problems  Orders Placed This Encounter  Procedures   MM 3D SCREENING MAMMOGRAM BILATERAL BREAST   RPR+HBsAg+HCVAb+...    Meds: No orders of the defined types were placed in this encounter.   Follow-up: Return in about 1 year (around 03/29/2025) for [place] Nidia Delores, FNP

## 2024-03-29 NOTE — Progress Notes (Signed)
 Pt states she hasn't had a period since March of this year. She has experienced some spotting since then.   Pt states last PAP was in 2024. Would like PAP today.   Had bi-lat cyst in breasts. Had biopsy on both, came back negative on both.   Would like referral for mammogram. Pt states she has had a colonscopy before and it came back ok.   Would STD testing.

## 2024-03-30 ENCOUNTER — Ambulatory Visit: Payer: Self-pay | Admitting: Obstetrics and Gynecology

## 2024-03-30 LAB — RPR+HBSAG+HCVAB+...
HIV Screen 4th Generation wRfx: NONREACTIVE
Hep C Virus Ab: NONREACTIVE
Hepatitis B Surface Ag: NEGATIVE
RPR Ser Ql: NONREACTIVE

## 2024-04-11 LAB — CYTOLOGY - PAP
Adequacy: ABSENT
Chlamydia: NEGATIVE
Comment: NEGATIVE
Comment: NEGATIVE
Comment: NEGATIVE
Comment: NORMAL
Diagnosis: UNDETERMINED — AB
High risk HPV: NEGATIVE
Neisseria Gonorrhea: NEGATIVE
Trichomonas: NEGATIVE

## 2024-05-01 LAB — HM MAMMOGRAPHY

## 2024-06-08 ENCOUNTER — Encounter: Payer: Self-pay | Admitting: Nurse Practitioner

## 2024-06-08 ENCOUNTER — Ambulatory Visit (INDEPENDENT_AMBULATORY_CARE_PROVIDER_SITE_OTHER): Admitting: Nurse Practitioner

## 2024-06-08 VITALS — BP 140/60 | HR 70 | Temp 98.5°F | Ht 62.0 in | Wt 138.0 lb

## 2024-06-08 DIAGNOSIS — Z139 Encounter for screening, unspecified: Secondary | ICD-10-CM | POA: Diagnosis not present

## 2024-06-08 DIAGNOSIS — E7800 Pure hypercholesterolemia, unspecified: Secondary | ICD-10-CM

## 2024-06-08 DIAGNOSIS — I10 Essential (primary) hypertension: Secondary | ICD-10-CM | POA: Diagnosis not present

## 2024-06-08 DIAGNOSIS — Z2821 Immunization not carried out because of patient refusal: Secondary | ICD-10-CM | POA: Diagnosis not present

## 2024-06-08 MED ORDER — TELMISARTAN 20 MG PO TABS: 20.0000 mg | ORAL_TABLET | Freq: Every day | ORAL | 1 refills | Status: AC

## 2024-06-08 NOTE — Progress Notes (Signed)
 LILLETTE [name], CMA,acting as a Neurosurgeon for Donna Ada, FNP.,have documented all relevant documentation on the behalf of Donna Ada, FNP,as directed by  Donna Ada, FNP while in the presence of Donna Ada, FNP.  Subjective:  Patient ID: Donna Barrera , female    DOB: [birth year] , 53 y.o.   MRN: [id number]  Chief Complaint  Patient presents with   Hypertension    Patient presents today for a bp and CHOL follow up, Patient reports compliance with medication. Patient denies any chest pain, SOB, or headaches. Patient has no concerns today.    Discussed the use of AI scribe software for clinical note transcription with the patient, who gave verbal consent to proceed.  History of Present Illness [name] is a 53 year old female with hypertension and hyperlipidemia who presents for follow-up.  She anticipates needing a refill soon. No swelling in her ankles, headaches, or dizziness. She maintains a regular exercise routine, engaging in both cardio and strength training five days a week. Her BMI is 25.  Her cholesterol levels were previously normal without medication, and she will have her cholesterol rechecked today. She fasted this morning in preparation for lab work. She will also have her kidney function checked due to her medication use, and her immunity to hepatitis B will be assessed.  She mentions a recent rough patch with her family due to her father's illness.    Past Medical History:  Diagnosis Date   Hypertension      Family History  Problem Relation Age of Onset   Breast cancer Mother    Cancer Mother    Hypertension Maternal Grandmother    Cancer Maternal Grandmother      Current Outpatient Medications:    telmisartan  (MICARDIS ) 20 MG tablet, Take 1 tablet (20 mg total) by mouth daily., Disp: 90 tablet, Rfl: 1   No Known Allergies   Review of Systems  Constitutional: Negative.  Negative for activity change and fatigue.  Eyes:  Negative  for visual disturbance.  Respiratory: Negative.  Negative for choking, shortness of breath and wheezing.   Cardiovascular: Negative.  Negative for chest pain, palpitations and leg swelling.  Gastrointestinal: Negative.   Endocrine: Negative.  Negative for polydipsia, polyphagia and polyuria.  Musculoskeletal: Negative.   Skin: Negative.   Neurological:  Negative for dizziness, weakness and headaches.  Psychiatric/Behavioral: Negative.  Negative for confusion. The patient is not nervous/anxious.      Today's Vitals   06/08/24 1048  BP: (!) 140/60  Pulse: 70  Temp: 98.5 F (36.9 C)  TempSrc: Oral  Weight: 138 lb (62.6 kg)  Height: 5' 2 (1.575 m)  PainSc: 0-No pain   Body mass index is 25.24 kg/m.  Wt Readings from Last 3 Encounters:  06/08/24 138 lb (62.6 kg)  03/29/24 138 lb 3.2 oz (62.7 kg)  12/07/23 137 lb (62.1 kg)     Objective:  Physical Exam Vitals and nursing note reviewed.  Constitutional:      General: She is not in acute distress.    Appearance: Normal appearance. She is well-developed.  HENT:     Head: Normocephalic and atraumatic.  Eyes:     Pupils: Pupils are equal, round, and reactive to light.  Cardiovascular:     Rate and Rhythm: Normal rate and regular rhythm.     Pulses: Normal pulses.     Heart sounds: Normal heart sounds. No murmur heard. Pulmonary:     Effort: Pulmonary effort is normal. No respiratory distress.  Breath sounds: Normal breath sounds. No wheezing.  Skin:    General: Skin is warm and dry.     Capillary Refill: Capillary refill takes less than 2 seconds.  Neurological:     General: No focal deficit present.     Mental Status: She is alert and oriented to person, place, and time.     Cranial Nerves: No cranial nerve deficit.     Motor: No weakness.  Psychiatric:        Mood and Affect: Mood normal.        Behavior: Behavior normal.        Thought Content: Thought content normal.        Judgment: Judgment normal.       Assessment And Plan:  Essential hypertension Assessment & Plan: Blood pressure 140/60 mmHg, elevated systolic. On telmisartan  20 mg daily. - Recheck blood pressure before departure. - Refill telmisartan  20 mg oral daily prescription. - Encouraged regular exercise, 5 days a week, including cardio and strength training.  Orders: -     BMP8+eGFR -     Telmisartan ; Take 1 tablet (20 mg total) by mouth daily.  Dispense: 90 tablet; Refill: 1  Elevated LDL cholesterol level Assessment & Plan:  Previous cholesterol levels normal without medication. Maintains regular exercise and BMI of 25. - Order cholesterol lab test today to reassess levels. - Cholesterol levels were slightly elevated, cut back on fried and fatty foods.   Orders: -     Lipid panel  Influenza vaccination declined  Encounter for screening -     Hepatitis B surface antibody,qualitative    Return for KEEP SAME NEXT.  Patient was given opportunity to ask questions. Patient verbalized understanding of the plan and was able to repeat key elements of the plan. All questions were answered to their satisfaction.   LILLETTE Donna Ada, FNP, have reviewed all documentation for this visit. The documentation on 06/08/24 for the exam, diagnosis, procedures, and orders are all accurate and complete.    IF YOU HAVE BEEN REFERRED TO A SPECIALIST, IT MAY TAKE 1-2 WEEKS TO SCHEDULE/PROCESS THE REFERRAL. IF YOU HAVE NOT HEARD FROM US /SPECIALIST IN TWO WEEKS, PLEASE GIVE US  A CALL AT [phone].

## 2024-06-09 LAB — BMP8+EGFR
BUN/Creatinine Ratio: 20 (ref 9–23)
BUN: 18 mg/dL (ref 6–24)
CO2: 25 mmol/L (ref 20–29)
Calcium: 9.7 mg/dL (ref 8.7–10.2)
Chloride: 104 mmol/L (ref 96–106)
Creatinine, Ser: 0.9 mg/dL (ref 0.57–1.00)
Glucose: 76 mg/dL (ref 70–99)
Potassium: 4.4 mmol/L (ref 3.5–5.2)
Sodium: 141 mmol/L (ref 134–144)
eGFR: 75 mL/min/1.73 (ref >59)

## 2024-06-09 LAB — LIPID PANEL
Chol/HDL Ratio: 3.1 ratio (ref 0.0–4.4)
Cholesterol, Total: 198 mg/dL (ref 100–199)
HDL: 63 mg/dL (ref >39)
LDL Chol Calc (NIH): 126 mg/dL — ABNORMAL HIGH (ref 0–99)
Triglycerides: 49 mg/dL (ref 0–149)
VLDL Cholesterol Cal: 9 mg/dL (ref 5–40)

## 2024-06-09 LAB — HEPATITIS B SURFACE ANTIBODY,QUALITATIVE: Hep B Surface Ab, Qual: NONREACTIVE

## 2024-06-18 ENCOUNTER — Ambulatory Visit: Payer: Self-pay | Admitting: Nurse Practitioner

## 2024-06-18 NOTE — Assessment & Plan Note (Signed)
 Blood pressure 140/60 mmHg, elevated systolic. On telmisartan  20 mg daily. - Recheck blood pressure before departure. - Refill telmisartan  20 mg oral daily prescription. - Encouraged regular exercise, 5 days a week, including cardio and strength training.

## 2024-06-18 NOTE — Assessment & Plan Note (Signed)
  Previous cholesterol levels normal without medication. Maintains regular exercise and BMI of 25. - Order cholesterol lab test today to reassess levels. - Cholesterol levels were slightly elevated, cut back on fried and fatty foods.

## 2024-07-20 ENCOUNTER — Encounter: Payer: Self-pay | Admitting: Nurse Practitioner

## 2024-07-20 ENCOUNTER — Ambulatory Visit (INDEPENDENT_AMBULATORY_CARE_PROVIDER_SITE_OTHER): Admitting: Nurse Practitioner

## 2024-07-20 VITALS — BP 120/80 | HR 77 | Temp 98.3°F | Ht 62.0 in | Wt 136.8 lb

## 2024-07-20 DIAGNOSIS — N3001 Acute cystitis with hematuria: Secondary | ICD-10-CM | POA: Diagnosis not present

## 2024-07-20 DIAGNOSIS — R319 Hematuria, unspecified: Secondary | ICD-10-CM

## 2024-07-20 DIAGNOSIS — N390 Urinary tract infection, site not specified: Secondary | ICD-10-CM

## 2024-07-20 DIAGNOSIS — Z23 Encounter for immunization: Secondary | ICD-10-CM

## 2024-07-20 LAB — POCT URINALYSIS DIP (CLINITEK)
Bilirubin, UA: NEGATIVE
Glucose, UA: NEGATIVE mg/dL
Ketones, POC UA: NEGATIVE mg/dL
Nitrite, UA: POSITIVE — AB
Spec Grav, UA: <=1.005 — AB (ref 1.010–1.025)
Urobilinogen, UA: 0.2 U/dL
pH, UA: 6 (ref 5.0–8.0)

## 2024-07-20 MED ORDER — NITROFURANTOIN MONOHYD MACRO 100 MG PO CAPS: 100.0000 mg | ORAL_CAPSULE | Freq: Two times a day (BID) | ORAL | 0 refills | Status: AC

## 2024-07-20 NOTE — Progress Notes (Signed)
 LILLETTE [name], CMA,acting as a neurosurgeon for [name], FNP.,have documented all relevant documentation on the behalf of [name], FNP,as directed by  [name], FNP while in the presence of [name], FNP.  Subjective:  Patient ID: Donna Barrera , female    DOB: Apr 14, 1971 , 53 y.o.   MRN: [id number]  Chief Complaint  Patient presents with   cloudy urine    Patient reports she has been experiencing cloudy urine and blood in urine. Patient reports this first started on Monday. Patient reports having burning and frequency but it is not better.       Past Medical History:  Diagnosis Date   Hypertension      Family History  Problem Relation Age of Onset   Breast cancer Mother    Cancer Mother    Hypertension Maternal Grandmother    Cancer Maternal Grandmother      Current Outpatient Medications:    nitrofurantoin, macrocrystal-monohydrate, (MACROBID) 100 MG capsule, Take 1 capsule (100 mg total) by mouth 2 (two) times daily for 7 days., Disp: 14 capsule, Rfl: 0   telmisartan  (MICARDIS ) 20 MG tablet, Take 1 tablet (20 mg total) by mouth daily., Disp: 90 tablet, Rfl: 1   No Known Allergies   Review of Systems  Constitutional: Negative.   Respiratory: Negative.    Cardiovascular: Negative.   Genitourinary:  Positive for dysuria and frequency.     Today's Vitals   07/20/24 1613  BP: 120/80  Pulse: 77  Temp: 98.3 F (36.8 C)  TempSrc: Oral  Weight: 136 lb 12.8 oz (62.1 kg)  Height: 5' 2 (1.575 m)  PainSc: 0-No pain   Body mass index is 25.02 kg/m.  Wt Readings from Last 3 Encounters:  07/20/24 136 lb 12.8 oz (62.1 kg)  06/08/24 138 lb (62.6 kg)  03/29/24 138 lb 3.2 oz (62.7 kg)      Objective:  Physical Exam Vitals and nursing note reviewed.  Constitutional:      General: She is not in acute distress.    Appearance: Normal appearance. She is well-developed.  HENT:     Head: Normocephalic and atraumatic.  Eyes:     Pupils: Pupils are  equal, round, and reactive to light.  Cardiovascular:     Rate and Rhythm: Normal rate and regular rhythm.     Pulses: Normal pulses.     Heart sounds: Normal heart sounds. No murmur heard. Pulmonary:     Effort: Pulmonary effort is normal. No respiratory distress.     Breath sounds: Normal breath sounds. No wheezing.  Skin:    General: Skin is warm and dry.     Capillary Refill: Capillary refill takes less than 2 seconds.  Neurological:     General: No focal deficit present.     Mental Status: She is alert and oriented to person, place, and time.     Cranial Nerves: No cranial nerve deficit.     Motor: No weakness.  Psychiatric:        Mood and Affect: Mood normal.        Behavior: Behavior normal.        Thought Content: Thought content normal.        Judgment: Judgment normal.         Assessment And Plan:  Acute cystitis with hematuria Assessment & Plan: Positive nitrates, will treat with nitrofuratoin. Culture was not sent unfortunately.   Orders: -     POCT URINALYSIS DIP (CLINITEK) -  Urine Culture -     Nitrofurantoin Monohyd Macro; Take 1 capsule (100 mg total) by mouth 2 (two) times daily for 7 days.  Dispense: 14 capsule; Refill: 0    Return for keep same next.  Patient was given opportunity to ask questions. Patient verbalized understanding of the plan and was able to repeat key elements of the plan. All questions were answered to their satisfaction.    LILLETTE [name], FNP, have reviewed all documentation for this visit. The documentation on 07/20/24 for the exam, diagnosis, procedures, and orders are all accurate and complete.   IF YOU HAVE BEEN REFERRED TO A SPECIALIST, IT MAY TAKE 1-2 WEEKS TO SCHEDULE/PROCESS THE REFERRAL. IF YOU HAVE NOT HEARD FROM US /SPECIALIST IN TWO WEEKS, PLEASE GIVE US  A CALL AT [phone].

## 2024-07-26 ENCOUNTER — Encounter: Payer: Self-pay | Admitting: Nurse Practitioner

## 2024-07-26 DIAGNOSIS — N3001 Acute cystitis with hematuria: Secondary | ICD-10-CM | POA: Insufficient documentation

## 2024-07-26 NOTE — Assessment & Plan Note (Signed)
 Positive nitrates, will treat with nitrofuratoin. Culture was not sent unfortunately.

## 2024-12-07 ENCOUNTER — Ambulatory Visit: Payer: Self-pay | Admitting: Nurse Practitioner

## 2024-12-07 ENCOUNTER — Encounter: Payer: Self-pay | Admitting: Nurse Practitioner
